# Patient Record
Sex: Male | Born: 1960 | Race: White | Hispanic: No | Marital: Married | Smoking: Former smoker
Health system: Southern US, Community
[De-identification: ages and names within clinical notes are randomized; demographics above are authoritative.]

## PROBLEM LIST (undated history)

## (undated) DIAGNOSIS — C801 Malignant (primary) neoplasm, unspecified: Secondary | ICD-10-CM

## (undated) DIAGNOSIS — J329 Chronic sinusitis, unspecified: Secondary | ICD-10-CM

## (undated) DIAGNOSIS — R51 Headache: Secondary | ICD-10-CM

## (undated) DIAGNOSIS — J189 Pneumonia, unspecified organism: Secondary | ICD-10-CM

## (undated) DIAGNOSIS — R05 Cough: Secondary | ICD-10-CM

## (undated) DIAGNOSIS — R059 Cough, unspecified: Secondary | ICD-10-CM

## (undated) HISTORY — PX: HERNIA REPAIR: SHX51

## (undated) HISTORY — DX: Cough, unspecified: R05.9

## (undated) HISTORY — DX: Chronic sinusitis, unspecified: J32.9

## (undated) HISTORY — PX: OTHER SURGICAL HISTORY: SHX169

## (undated) HISTORY — PX: LIVER LOBECTOMY: SHX1976

## (undated) HISTORY — DX: Cough: R05

## (undated) HISTORY — PX: COLECTOMY: SHX59

## (undated) HISTORY — PX: CHOLECYSTECTOMY: SHX55

## (undated) HISTORY — PX: LUNG SURGERY: SHX703

## (undated) HISTORY — PX: SINUS ENDO W/FUSION: SHX777

---

## 2001-04-07 ENCOUNTER — Encounter: Payer: Self-pay | Admitting: Orthopedic Surgery

## 2001-04-07 ENCOUNTER — Encounter: Admission: RE | Admit: 2001-04-07 | Discharge: 2001-04-07 | Payer: Self-pay | Admitting: Orthopedic Surgery

## 2001-04-28 ENCOUNTER — Encounter: Admission: RE | Admit: 2001-04-28 | Discharge: 2001-04-28 | Payer: Self-pay | Admitting: Orthopedic Surgery

## 2001-04-28 ENCOUNTER — Encounter: Payer: Self-pay | Admitting: Orthopedic Surgery

## 2006-02-04 ENCOUNTER — Encounter: Payer: Self-pay | Admitting: Pulmonary Disease

## 2006-05-03 ENCOUNTER — Encounter: Payer: Self-pay | Admitting: Pulmonary Disease

## 2006-05-07 ENCOUNTER — Ambulatory Visit: Payer: Self-pay | Admitting: Pulmonary Disease

## 2006-05-09 ENCOUNTER — Ambulatory Visit: Payer: Self-pay | Admitting: Cardiovascular Disease

## 2006-05-09 ENCOUNTER — Encounter: Payer: Self-pay | Admitting: Pulmonary Disease

## 2006-05-15 ENCOUNTER — Ambulatory Visit: Payer: Self-pay | Admitting: Pulmonary Disease

## 2006-06-07 ENCOUNTER — Ambulatory Visit: Payer: Self-pay | Admitting: Pulmonary Disease

## 2006-07-11 ENCOUNTER — Ambulatory Visit (HOSPITAL_COMMUNITY): Admission: RE | Admit: 2006-07-11 | Discharge: 2006-07-11 | Payer: Self-pay | Admitting: Otolaryngology

## 2006-07-11 ENCOUNTER — Encounter (INDEPENDENT_AMBULATORY_CARE_PROVIDER_SITE_OTHER): Payer: Self-pay | Admitting: Otolaryngology

## 2006-08-13 ENCOUNTER — Ambulatory Visit: Payer: Self-pay | Admitting: Internal Medicine

## 2006-08-27 ENCOUNTER — Ambulatory Visit: Payer: Self-pay | Admitting: Pulmonary Disease

## 2006-09-11 ENCOUNTER — Ambulatory Visit: Payer: Self-pay | Admitting: Pulmonary Disease

## 2006-10-07 ENCOUNTER — Ambulatory Visit: Payer: Self-pay | Admitting: Pulmonary Disease

## 2006-10-14 ENCOUNTER — Ambulatory Visit: Payer: Self-pay | Admitting: Pulmonary Disease

## 2006-10-24 ENCOUNTER — Ambulatory Visit: Payer: Self-pay | Admitting: Pulmonary Disease

## 2006-10-26 DIAGNOSIS — J45909 Unspecified asthma, uncomplicated: Secondary | ICD-10-CM | POA: Insufficient documentation

## 2006-11-02 DIAGNOSIS — J329 Chronic sinusitis, unspecified: Secondary | ICD-10-CM | POA: Insufficient documentation

## 2006-11-13 ENCOUNTER — Ambulatory Visit: Payer: Self-pay | Admitting: Pulmonary Disease

## 2006-11-29 ENCOUNTER — Telehealth: Payer: Self-pay | Admitting: Pulmonary Disease

## 2006-12-11 ENCOUNTER — Ambulatory Visit: Payer: Self-pay | Admitting: Pulmonary Disease

## 2006-12-11 ENCOUNTER — Encounter: Payer: Self-pay | Admitting: Pulmonary Disease

## 2006-12-30 ENCOUNTER — Ambulatory Visit: Payer: Self-pay | Admitting: Pulmonary Disease

## 2006-12-30 DIAGNOSIS — R05 Cough: Secondary | ICD-10-CM

## 2006-12-30 DIAGNOSIS — R059 Cough, unspecified: Secondary | ICD-10-CM | POA: Insufficient documentation

## 2007-01-06 ENCOUNTER — Encounter: Payer: Self-pay | Admitting: Pulmonary Disease

## 2007-01-06 ENCOUNTER — Telehealth: Payer: Self-pay | Admitting: Pulmonary Disease

## 2007-01-08 ENCOUNTER — Inpatient Hospital Stay (HOSPITAL_COMMUNITY): Admission: EM | Admit: 2007-01-08 | Discharge: 2007-01-15 | Payer: Self-pay | Admitting: Emergency Medicine

## 2007-01-08 ENCOUNTER — Telehealth (INDEPENDENT_AMBULATORY_CARE_PROVIDER_SITE_OTHER): Payer: Self-pay | Admitting: *Deleted

## 2007-01-08 ENCOUNTER — Ambulatory Visit: Payer: Self-pay | Admitting: Pulmonary Disease

## 2007-01-10 ENCOUNTER — Encounter: Payer: Self-pay | Admitting: Pulmonary Disease

## 2007-01-27 ENCOUNTER — Ambulatory Visit: Payer: Self-pay | Admitting: Pulmonary Disease

## 2007-06-30 ENCOUNTER — Ambulatory Visit: Payer: Self-pay | Admitting: Pulmonary Disease

## 2007-08-06 ENCOUNTER — Encounter: Payer: Self-pay | Admitting: Pulmonary Disease

## 2007-09-18 ENCOUNTER — Telehealth (INDEPENDENT_AMBULATORY_CARE_PROVIDER_SITE_OTHER): Payer: Self-pay | Admitting: *Deleted

## 2007-09-18 ENCOUNTER — Encounter: Payer: Self-pay | Admitting: Pulmonary Disease

## 2007-09-19 ENCOUNTER — Ambulatory Visit: Payer: Self-pay | Admitting: Internal Medicine

## 2007-09-22 ENCOUNTER — Encounter: Payer: Self-pay | Admitting: Pulmonary Disease

## 2007-10-10 ENCOUNTER — Encounter: Payer: Self-pay | Admitting: Pulmonary Disease

## 2007-10-13 ENCOUNTER — Telehealth: Payer: Self-pay | Admitting: Pulmonary Disease

## 2007-10-15 ENCOUNTER — Ambulatory Visit: Payer: Self-pay | Admitting: Pulmonary Disease

## 2007-10-27 ENCOUNTER — Ambulatory Visit: Payer: Self-pay | Admitting: Pulmonary Disease

## 2007-10-27 ENCOUNTER — Inpatient Hospital Stay (HOSPITAL_COMMUNITY): Admission: AD | Admit: 2007-10-27 | Discharge: 2007-10-31 | Payer: Self-pay | Admitting: Pulmonary Disease

## 2007-10-31 ENCOUNTER — Telehealth (INDEPENDENT_AMBULATORY_CARE_PROVIDER_SITE_OTHER): Payer: Self-pay | Admitting: *Deleted

## 2007-11-03 ENCOUNTER — Telehealth (INDEPENDENT_AMBULATORY_CARE_PROVIDER_SITE_OTHER): Payer: Self-pay | Admitting: *Deleted

## 2007-11-25 ENCOUNTER — Telehealth (INDEPENDENT_AMBULATORY_CARE_PROVIDER_SITE_OTHER): Payer: Self-pay | Admitting: *Deleted

## 2007-12-01 ENCOUNTER — Ambulatory Visit: Payer: Self-pay | Admitting: Pulmonary Disease

## 2007-12-01 DIAGNOSIS — J209 Acute bronchitis, unspecified: Secondary | ICD-10-CM | POA: Insufficient documentation

## 2007-12-01 DIAGNOSIS — J019 Acute sinusitis, unspecified: Secondary | ICD-10-CM | POA: Insufficient documentation

## 2007-12-05 ENCOUNTER — Telehealth (INDEPENDENT_AMBULATORY_CARE_PROVIDER_SITE_OTHER): Payer: Self-pay | Admitting: *Deleted

## 2007-12-09 ENCOUNTER — Ambulatory Visit: Payer: Self-pay | Admitting: Pulmonary Disease

## 2007-12-09 ENCOUNTER — Inpatient Hospital Stay (HOSPITAL_COMMUNITY): Admission: AD | Admit: 2007-12-09 | Discharge: 2007-12-12 | Payer: Self-pay | Admitting: Pulmonary Disease

## 2007-12-10 ENCOUNTER — Encounter (INDEPENDENT_AMBULATORY_CARE_PROVIDER_SITE_OTHER): Payer: Self-pay | Admitting: Otolaryngology

## 2007-12-12 ENCOUNTER — Ambulatory Visit (HOSPITAL_COMMUNITY): Admission: RE | Admit: 2007-12-12 | Discharge: 2007-12-12 | Payer: Self-pay | Admitting: Pediatrics

## 2007-12-24 ENCOUNTER — Ambulatory Visit: Payer: Self-pay | Admitting: Pulmonary Disease

## 2008-01-07 ENCOUNTER — Encounter: Payer: Self-pay | Admitting: Pulmonary Disease

## 2008-01-27 ENCOUNTER — Encounter: Payer: Self-pay | Admitting: Pulmonary Disease

## 2008-03-23 ENCOUNTER — Ambulatory Visit: Payer: Self-pay | Admitting: Pulmonary Disease

## 2008-06-28 ENCOUNTER — Telehealth (INDEPENDENT_AMBULATORY_CARE_PROVIDER_SITE_OTHER): Payer: Self-pay | Admitting: *Deleted

## 2008-06-29 ENCOUNTER — Ambulatory Visit: Payer: Self-pay | Admitting: Pulmonary Disease

## 2008-07-13 ENCOUNTER — Encounter: Payer: Self-pay | Admitting: Pulmonary Disease

## 2008-09-01 ENCOUNTER — Encounter: Payer: Self-pay | Admitting: Pulmonary Disease

## 2008-09-24 ENCOUNTER — Ambulatory Visit: Payer: Self-pay | Admitting: Pulmonary Disease

## 2008-12-14 ENCOUNTER — Encounter: Payer: Self-pay | Admitting: Pulmonary Disease

## 2009-01-24 ENCOUNTER — Ambulatory Visit: Payer: Self-pay | Admitting: Pulmonary Disease

## 2009-03-15 ENCOUNTER — Encounter: Payer: Self-pay | Admitting: Pulmonary Disease

## 2009-03-23 ENCOUNTER — Ambulatory Visit: Payer: Self-pay | Admitting: Pulmonary Disease

## 2009-03-31 ENCOUNTER — Ambulatory Visit: Payer: Self-pay | Admitting: Pulmonary Disease

## 2009-04-11 ENCOUNTER — Telehealth (INDEPENDENT_AMBULATORY_CARE_PROVIDER_SITE_OTHER): Payer: Self-pay | Admitting: *Deleted

## 2009-04-18 ENCOUNTER — Telehealth (INDEPENDENT_AMBULATORY_CARE_PROVIDER_SITE_OTHER): Payer: Self-pay | Admitting: *Deleted

## 2009-04-18 ENCOUNTER — Inpatient Hospital Stay (HOSPITAL_COMMUNITY): Admission: AD | Admit: 2009-04-18 | Discharge: 2009-04-22 | Payer: Self-pay | Admitting: Pulmonary Disease

## 2009-04-18 ENCOUNTER — Ambulatory Visit: Payer: Self-pay | Admitting: Pulmonary Disease

## 2009-05-05 ENCOUNTER — Encounter: Payer: Self-pay | Admitting: Pulmonary Disease

## 2009-05-12 ENCOUNTER — Ambulatory Visit: Payer: Self-pay | Admitting: Pulmonary Disease

## 2009-06-03 ENCOUNTER — Telehealth: Payer: Self-pay | Admitting: Pulmonary Disease

## 2009-06-17 ENCOUNTER — Telehealth (INDEPENDENT_AMBULATORY_CARE_PROVIDER_SITE_OTHER): Payer: Self-pay | Admitting: *Deleted

## 2009-06-22 ENCOUNTER — Encounter: Payer: Self-pay | Admitting: Pulmonary Disease

## 2009-07-28 ENCOUNTER — Telehealth (INDEPENDENT_AMBULATORY_CARE_PROVIDER_SITE_OTHER): Payer: Self-pay | Admitting: *Deleted

## 2009-08-26 ENCOUNTER — Telehealth (INDEPENDENT_AMBULATORY_CARE_PROVIDER_SITE_OTHER): Payer: Self-pay | Admitting: *Deleted

## 2009-09-12 ENCOUNTER — Telehealth: Payer: Self-pay | Admitting: Internal Medicine

## 2009-09-14 ENCOUNTER — Ambulatory Visit: Payer: Self-pay | Admitting: Pulmonary Disease

## 2009-09-14 DIAGNOSIS — J45901 Unspecified asthma with (acute) exacerbation: Secondary | ICD-10-CM | POA: Insufficient documentation

## 2009-10-05 ENCOUNTER — Telehealth: Payer: Self-pay | Admitting: Pulmonary Disease

## 2009-11-18 ENCOUNTER — Telehealth (INDEPENDENT_AMBULATORY_CARE_PROVIDER_SITE_OTHER): Payer: Self-pay | Admitting: *Deleted

## 2009-11-18 ENCOUNTER — Ambulatory Visit: Payer: Self-pay | Admitting: Pulmonary Disease

## 2009-11-22 ENCOUNTER — Telehealth (INDEPENDENT_AMBULATORY_CARE_PROVIDER_SITE_OTHER): Payer: Self-pay | Admitting: *Deleted

## 2009-11-30 ENCOUNTER — Telehealth: Payer: Self-pay | Admitting: Pulmonary Disease

## 2009-12-07 ENCOUNTER — Ambulatory Visit: Payer: Self-pay | Admitting: Pulmonary Disease

## 2009-12-14 ENCOUNTER — Telehealth (INDEPENDENT_AMBULATORY_CARE_PROVIDER_SITE_OTHER): Payer: Self-pay | Admitting: *Deleted

## 2009-12-28 ENCOUNTER — Telehealth (INDEPENDENT_AMBULATORY_CARE_PROVIDER_SITE_OTHER): Payer: Self-pay | Admitting: *Deleted

## 2009-12-30 ENCOUNTER — Encounter: Payer: Self-pay | Admitting: Internal Medicine

## 2009-12-30 ENCOUNTER — Ambulatory Visit: Payer: Self-pay | Admitting: Internal Medicine

## 2010-01-04 LAB — CONVERTED CEMR LAB
Basophils Absolute: 0 10*3/uL (ref 0.0–0.1)
Basophils Relative: 0.3 % (ref 0.0–3.0)
Eosinophils Absolute: 0.1 10*3/uL (ref 0.0–0.7)
HCT: 44.5 % (ref 39.0–52.0)
Hemoglobin: 15 g/dL (ref 13.0–17.0)
Lymphocytes Relative: 8.8 % — ABNORMAL LOW (ref 12.0–46.0)
Monocytes Absolute: 0.6 10*3/uL (ref 0.1–1.0)
Monocytes Relative: 5.4 % (ref 3.0–12.0)
Neutrophils Relative %: 84.2 % — ABNORMAL HIGH (ref 43.0–77.0)

## 2010-01-05 ENCOUNTER — Ambulatory Visit: Payer: Self-pay | Admitting: Adult Health

## 2010-01-13 ENCOUNTER — Ambulatory Visit: Admit: 2010-01-13 | Payer: Self-pay | Admitting: Internal Medicine

## 2010-02-07 NOTE — Progress Notes (Signed)
Summary: cough > prednisone and augmentin  Phone Note Call from Patient Call back at [phone]   Caller: Patient Call For: clance Reason for Call: Talk to Nurse Summary of Call: Patient calling about his cough.  He said he has been on prednisone and it is not helping with his cough.  Patient asking to speak to nurse, patient being a little vague.  Initial call taken by: Lehman Prom,  December 14, 2009 8:20 AM  Follow-up for Phone Call        Cough was some better when on high dose of Prednisone.  Cough is now back and worse with yellow sputum.  Finished Pred taper today.  Was unable to take Merpergan.  Is still on higher dose of Dulera.  Pt reports that he now coughs so hard his back hurts and his esophagus is "burning".  Pt reports that endoscopy showed no acid reflux but stomach is not emptying properly.  Pt reports that he is ready to go in the hospital if Dr clance agrees.  Please advise. Abigail Miyamoto RN  December 14, 2009 9:05 AM   Additional Follow-up for Phone Call Additional follow up Details #1::        why was he not able to take mepergan?  this has not been an issue in the past. Additional Follow-up by: Barbaraann Share MD,  December 14, 2009 11:57 AM    Additional Follow-up for Phone Call Additional follow up Details #2::    Pt states he is taking the meperidine. He states his cough is better when he is on higher dose of prednisone, but when he tapers down his cough returns. He staes he coughs so much and so hard now that he actually passed out yesterday from it. He states meperidine does help some but cough never goes away. Pelase advsie. Carron Curie CMA  December 14, 2009 12:27 PM   Additional Follow-up for Phone Call Additional follow up Details #3:: Details for Additional Follow-up Action Taken: we know the problem when he starts coughing like this is usually infection in sinuses and airway inflammation.  If the prednisone helped at higher doses, will treat with  this along with abx.  He can continue the meperidine for cough.  He needs to make sure that he stays away from smokeless tobacco.  Lets give this some time and see if things improve.  He will not be able to go in hospital every time this happens....insurance will start denying (there were issues last admission if he remembers) call in prednisone 20mg ...2 each day until visit with me in 2 weeks (#40) also call in augmentin 875mg  one two times a day for 10 days, no fills.  Additional Follow-up by: Barbaraann Share MD,  December 14, 2009 1:35 PM  New/Updated Medications: PREDNISONE 20 MG TABS (PREDNISONE) 2 tabs by mouth once daily till gone AUGMENTIN 875-125 MG TABS (AMOXICILLIN-POT CLAVULANATE) Take 1 tablet by mouth two times a day x10days Prescriptions: AUGMENTIN 875-125 MG TABS (AMOXICILLIN-POT CLAVULANATE) Take 1 tablet by mouth two times a day x10days  #20 x 0   Entered by:   Boone Master CNA/MA   Authorized by:   Barbaraann Share MD   Signed by:   Boone Master CNA/MA on 12/14/2009   Method used:   Electronically to        [facility]* (retail)       [address]       [place], [place]  [address]  Ph: [phone]       Fax: [phone]   RxID:   [id number] PREDNISONE 20 MG TABS (PREDNISONE) 2 tabs by mouth once daily till gone  #40 x 0   Entered by:   Boone Master CNA/MA   Authorized by:   Barbaraann Share MD   Signed by:   Boone Master CNA/MA on 12/14/2009   Method used:   Electronically to        [facility] (retail)       [address]       [place], [place]  [address]       Ph: [phone]       Fax: [phone]   RxID:   [id number]    called spoke with patient.  advised of KC's recs as stated above.  pt verbalized his understanding.  prednisone and augmentin sent to [facility].  pt is aware to keep the 1.5.12 appt w/ KC.

## 2010-02-07 NOTE — Letter (Signed)
Summary: [facility]  [facility]   Imported By: Lennie Odor 03/30/2009 11:22:23  _____________________________________________________________________  External Attachment:    Type:   Image     Comment:   External Document

## 2010-02-07 NOTE — Progress Notes (Signed)
Summary: ? admit today per pt - spoke w/ KC yesterday----PATIENT SICK----  Phone Note Call from Patient Call back at Home Phone [phone]   Caller: Patient Call For: clance Summary of Call: Pt said he spoke with [facility] yesterday, and he told him to call today if his cough wasn't better and he would admit him to the hospital, cough not better, pls advise. Initial call taken by: Darletta Moll,  April 18, 2009 9:50 AM  Follow-up for Phone Call        called, spoke with pt.  Pt states he is still coughing "uncontrollably."  States he hasn't slept in 2 nights.  States it is a prod cough with "milky" mucus.  States he talked with [facility] yesterday and was told to call office back if cough is no better today and he would probably be admitted.  Will forward to [facility] - pls advise.  Thanks! Gweneth Dimitri RN  April 18, 2009 10:09 AM     Additional Follow-up for Phone Call Additional follow up Details #1::        Aundra Millet, find out where he wants to be admitted and then work on a regular floor bed for refractory cough.  thanks.  let me know the info Additional Follow-up by: Barbaraann Share MD,  April 18, 2009 10:58 AM    Additional Follow-up for Phone Call Additional follow up Details #2::    spoke with pt.  pt states he doesn't have a preference on [facility] or [facility].  Will admit to [facility].  called bed control at [facility] and waiting on a bed.  Aundra Millet Reynolds LPN  April 18, 2009 11:05 AM   received call back from [facility] with Bed Control.  Pt's room # is 1517 at [facility].  called and spoke with pt.  pt aware to go to Admitting at [facility] and that his room# is 1517  [employer] LPN  April 18, 2009 12:00 PM

## 2010-02-07 NOTE — Assessment & Plan Note (Signed)
Summary: acute sick visit for refractory cough   Visit Type:  Follow-up Copy to:  [facility] Primary Provider/Referring Provider:  Dr. Bebe Shaggy  CC:  2 week follow up. pt states breahting is getting worse. pt c/o cough w/ milky thick sticky phlem, wheezing, and chest tightness and congestions. pt states he needs a refill on ativan. pt states he quit smoking in 1980's. Marland Kitchen  History of Present Illness: patient comes in today for an acute sick visit related to worsening chronic cough.  The pt has known asthma, chronic sinusitis, and severe upper airway dysfunction syndrome that I suspect is being partially driven by his excessive smokeless tobacco use.  He was just seen by our NP 3 weeks ago for refractory cough and sob, and given a prednisone taper.  He had been seen by ENT just before that visit, and had been treated with avelox x 10d for acute on chronic sinusitis.  He comes in today where he continues to have violent coughing with white sticky mucus, worsening chest congestion, and increasing sob.  The pt states that he does better on prednisone, but has worsening on lower doses.    Current Medications (verified): 1)  Ventolin Hfa 108 (90 Base) Mcg/act  Aers (Albuterol Sulfate) .Marland Kitchen.. 1-2 Puffs Every 4-6 Hours As Needed 2)  Prilosec 20 Mg  Cpdr (Omeprazole) .... Take 1 Tablet By Mouth Two Times A Day 3)  Dulera 100-5 Mcg/act Aero (Mometasone Furo-Formoterol Fum) .... Inhale 2 Puffs Two Times A Day 4)  Albuterol Sulfate (2.5 Mg/32ml) 0.083% Nebu (Albuterol Sulfate) .... Use 1 Vial in Nebulizer Every 4 Hours As Needed 5)  Neurontin 300 Mg Caps (Gabapentin) .... Take 1 Tablet By Mouth Two Times A Day 6)  Ativan 1 Mg Tabs (Lorazepam) .Marland Kitchen.. 1 By Mouth Two Times A Day As Needed Anxiety 7)  Celexa 20 Mg Tabs (Citalopram Hydrobromide) .... 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   Past Surgical History: s/p colectomy  Family History: Reviewed history from 10/27/2007 and no changes required. sister with severe obstructive disease. o/w unremarkable per pt.  Social History: Reviewed history from 10/27/2007 and no changes required. 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 uses smokeless tobacco regularly  Review of Systems       The patient complains of shortness of breath with activity, shortness of breath at rest, productive cough, headaches, anxiety, and depression.  The patient denies non-productive cough, coughing up blood, chest pain, irregular heartbeats, acid heartburn, indigestion, loss of appetite, weight change, abdominal pain, difficulty swallowing, sore throat, tooth/dental problems, nasal congestion/difficulty breathing through nose, sneezing, itching, ear ache, hand/feet swelling, joint stiffness or pain, rash, change in color of mucus, and fever.    Vital Signs:  Patient profile:   50 year old male Height:      74 inches Weight:      186.13 pounds BMI:     23.98 O2 Sat:      98 % on Room air Temp:     98.1 degrees F oral Pulse rate:   68 / minute BP sitting:   118 / 66  (left arm) Cuff size:   regular  Vitals Entered  By: Carver Fila (December 07, 2009 9:38 AM)  O2 Flow:  Room air CC: 2 week follow up. pt states breahting is getting worse. pt c/o cough w/ milky thick sticky phlem, wheezing, chest tightness and congestions. pt states he needs a refill on ativan. pt states he quit smoking in 1980's.  Comments meds and allergies updated Phone number updated Carver Fila  December 07, 2009 9:38 AM    Physical Exam  General:  thin male in nad Nose:  patent without discharge left with edema and mild  inflammation. Mouth:  clear, no exudates Lungs:  totally clear to auscultation +upper airway noise with transmission to lower airways.  no true wheezing. Heart:  rrr, no mrg Extremities:  no edema or cyanosis  Neurologic:  alert and oriented, moves all 4.   Impression & Recommendations:  Problem # 1:  COUGH (ICD-786.2)  the cough is clearly upper airway in origin, as it usually is.  It typically gets started with a sinus infection, and then propagates thru a cyclical cough mechanism.  He usually responds to prednisone and aggressive cough suppression.  He has required hospitalizaton in the past to suppress the cough enough with narcotics to break the cycle (as well as iv abx and steroids).  He is clearly not to that point at the visit today, although he is frustrated by all of this.  Will try and aggressively suppress his cough, treat with another course of steroids.  If he continues to have issues, will need a cxr for completeness.  The pt also tells me that he has an appt at [facility] for a second pulmonary opinion, which I have encouraged, considering the ongoing nature of his issues despite our best efforts.  Problem # 2:  ASTHMA, INTRINSIC (ICD-493.10) the pt is moving air well, and has no true wheezing on exam today.  Because he sees improvement with higher doses of prednisone,will increase his dulera to the higher strength as a trial.  However, we need to be careful not to irritate his upper airway further.    Medications Added to Medication List This Visit: 1)  Prednisone 10 Mg Tabs (Prednisone) .... Take 4 each day for 2 days, then 3 each day for 2 days, then 2 each day for 2 days, then 1 each day for 2 days, then stop 2)  Meperidine Hcl 50 Mg Tabs (Meperidine hcl) .... One every 4-6 hours as needed for cough suppression  Other Orders: Est. Patient Level IV ([address])  Patient Instructions: 1)  will put on prednisone taper to help with upper airway inflammation 2)  you need to stop  smokeless tobacco...this contributes to your upper instability 3)  take demerol for cough suppression up to every 4 hours, voice rest, hard candy to bathe the back of your throat. 4)  will increase dulera to the higher strength 200/5  2 inhalations am and pm...rinse mouth well. 5)  keep apptm with GI, and with [facility].   Prescriptions: MEPERIDINE HCL 50 MG TABS (MEPERIDINE HCL) one every 4-6 hours as needed for cough suppression  #40 x 0   Entered and Authorized by:   Barbaraann Share MD   Signed by:   Barbaraann Share MD on 12/07/2009   Method used:   Print then Give to Patient   RxID:   [id number] PREDNISONE 10 MG  TABS (PREDNISONE) take 4 each day for 2 days, then 3 each day for 2 days, then 2 each day for 2 days, then 1  each day for 2 days, then stop  #20 x 0   Entered and Authorized by:   Barbaraann Share MD   Signed by:   Barbaraann Share MD on 12/07/2009   Method used:   Print then Give to Patient   RxID:   [id number]

## 2010-02-07 NOTE — Progress Notes (Signed)
Summary: On call- Cough- biaxin, prednisone  Phone Note Call from Patient   Summary of Call: On Call- Reports bad cough, dark phlegm, asking antibiotic and prednisone taper.No definite fever. Plan- Biaxin 500mg  two times a day x 7 days. Prednisone taper 10mg , # 20, 8 days. [facility] [phone]. 09/10/09.  Keep f/u w/ Dr Shelle Iron. Initial call taken by: Waymon Budge MD,  September 12, 2009 8:02 PM

## 2010-02-07 NOTE — Assessment & Plan Note (Signed)
Summary: acute sick visit for cyclical cough with sinusitis   Copy to:  [facility] Primary Provider/Referring Provider:  Dr. Bebe Shaggy  CC:  Pt is here for a sick visit.  Pt states he recently saw Dr. Annalee Genta and was dx with sinus infection.  Pt states he was started on Avelox and Pred taper.  Pt states "infection has now traveled into chest."  Pt c/o head and chest congestion, coughing up green to brown sputum, and coughing so hard he is passing out.  Marland Kitchen  History of Present Illness: the pt comes in today for an acute sick visit.  He has known chronic sinusitis with underlying asthma, as well as a hypersensitized upper airway with predilection toward cyclical coughing.  He was recently diagosed with acute sinusitis by ENT, and was treated with avelox and prednisone taper.  He continues to have head congestion, but now having chest congestion and coughing up green mucus. He is still on his abx and steroid taper.  His main issue is severe refractory cough with near syncope.  This is a common problem for him when he gets infected.  Current Medications (verified): 1)  Ventolin Hfa 108 (90 Base) Mcg/act  Aers (Albuterol Sulfate) .Marland Kitchen.. 1-2 Puffs Every 4-6 Hours As Needed 2)  Prilosec 20 Mg  Cpdr (Omeprazole) .... Take 1 Tablet By Mouth Two Times A Day 3)  Symbicort 160-4.5 Mcg/act  Aero (Budesonide-Formoterol Fumarate) .... Two Puffs Twice Daily 4)  Albuterol Sulfate (2.5 Mg/33ml) 0.083% Nebu (Albuterol Sulfate) .... Use 1 Vial in Nebulizer Every 4 Hours As Needed 5)  Flonase 50 Mcg/act  Susp (Fluticasone Propionate) .... Two Puffs Each Nostril Daily 6)  Prednisone 5 Mg Tabs (Prednisone) .... Taper As Directed 7)  Avelox 400 Mg Tabs (Moxifloxacin Hcl) .... Take 1 Tablet By Mouth Once A Day For 14 Days  Allergies (verified): 1)  * Advair  Review of Systems       The patient complains of productive cough, nasal congestion/difficulty breathing through nose, and change in color of mucus.  The patient  denies shortness of breath with activity, shortness of breath at rest, non-productive cough, coughing up blood, chest pain, irregular heartbeats, acid heartburn, indigestion, loss of appetite, weight change, abdominal pain, difficulty swallowing, sore throat, tooth/dental problems, headaches, sneezing, itching, ear ache, anxiety, depression, hand/feet swelling, joint stiffness or pain, rash, and fever.    Vital Signs:  Patient profile:   50 year old male Height:      74 inches Weight:      186 pounds BMI:     23.97 O2 Sat:      98 % on Room air Temp:     98.2 degrees F oral Pulse rate:   89 / minute BP sitting:   126 / 80  (right arm) Cuff size:   regular  Vitals Entered By: Arman Filter LPN (March 23, 2009 10:21 AM)  O2 Flow:  Room air CC: Pt is here for a sick visit.  Pt states he recently saw Dr. Annalee Genta and was dx with sinus infection.  Pt states he was started on Avelox and Pred taper.  Pt states "infection has now traveled into chest."  Pt c/o head and chest congestion, coughing up green to brown sputum, coughing so hard he is passing out.   Comments Medications reviewed with patient Arman Filter LPN  March 23, 2009 10:24 AM    Physical Exam  General:  thin male in nad Nose:  mucosal edema and inflammation, no purulence  currently Lungs:  totally clear to auscultation. no wheezing or crackles. Heart:  rrr, no mrg. Extremities:  no edema noted, no cyanosis Neurologic:  alert and oriented, moves all 4.   Impression & Recommendations:  Problem # 1:  COUGH (ICD-786.2) the pt has a severe cyclical cough, and will need aggressive cough suppression.  In the past, he has responded to demerol/phenergan/ativan, but has also required hospitalization.  Will try the above "cocktail", and see if we can improve things.  He also understands the various behavioral techniques that can help as well.  Problem # 2:  SINUSITIS, CHRONIC NOS (ICD-473.9) this has been an ongoing problem for  him, and he is followed very closely by Dr. Annalee Genta.    Problem # 3:  ASTHMA, INTRINSIC (ICD-493.10) no evidence for acute flare.  Medications Added to Medication List This Visit: 1)  Prednisone 5 Mg Tabs (Prednisone) .... Taper as directed 2)  Avelox 400 Mg Tabs (Moxifloxacin hcl) .... Take 1 tablet by mouth once a day for 14 days 3)  Meperidine Hcl 50 Mg Tabs (Meperidine hcl) .... One every 4-6 hrs as needed. 4)  Promethazine Hcl 25 Mg Tabs (Promethazine hcl) .... One every 4-6hrs as needed. 5)  Ativan 0.5 Mg Tabs (Lorazepam) .... One in am and pm  Other Orders: Est. Patient Level IV (66440)  Patient Instructions: 1)  finish up abx and prednisone taper 2)  will try demerol 50mg  one every 4-6 hrs as needed for cough 3)  take phenergan 25mg  each time you take demerol.  Do not drive if taking medications 4)  ativan 0.5mg  one in am and pm while coughing. 5)  followup with me next week.   Prescriptions: ATIVAN 0.5 MG TABS (LORAZEPAM) one in am and pm  #15 x 0   Entered and Authorized by:   Barbaraann Share MD   Signed by:   Barbaraann Share MD on 03/23/2009   Method used:   Print then Give to Patient   RxID:   [id number] PROMETHAZINE HCL 25 MG TABS (PROMETHAZINE HCL) one every 4-6hrs as needed.  #40 x 0   Entered and Authorized by:   Barbaraann Share MD   Signed by:   Barbaraann Share MD on 03/23/2009   Method used:   Print then Give to Patient   RxID:   [id number] MEPERIDINE HCL 50 MG TABS (MEPERIDINE HCL) one every 4-6 hrs as needed.  #40 x 0   Entered and Authorized by:   Barbaraann Share MD   Signed by:   Barbaraann Share MD on 03/23/2009   Method used:   Print then Give to Patient   RxID:   [id number]

## 2010-02-07 NOTE — Progress Notes (Signed)
Summary: rx request  Phone Note Call from Patient   Caller: Patient Call For: clance Summary of Call: pt needs rx's [facility] to [facility] for promethazine and meperdine. fax # [phone]. pt # at work [phone]. cell [phone](call 1st) Initial call taken by: Tivis Ringer, CNA,  October 05, 2009 2:37 PM  Follow-up for Phone Call        ATC pt on cell # and was informed I had the wrong #.  ATC pt at work # [id number].  Aundra Millet Reynolds LPN  October 05, 2009 3:13 PM   (FYI- I [facility]- if kc does prescribe the meperdine, pt will need to take the written rx by hand to the pharmacist- they do not accept phone or fax order on this med)   Additional Follow-up for Phone Call Additional follow up Details #1::        called spoke with patient who states that his cough is doing well with occ mucus production of a milky, "gooey", sticky consistency.  denies wheezing, SOB.  states he was told by [facility] to take these meds everyday at 2pm and his rx from the hospital is getting low.  requests refills.  please advise, thanks! Additional Follow-up by: Boone Master CNA/MA,  October 05, 2009 3:22 PM    Additional Follow-up for Phone Call Additional follow up Details #2::    No that is not correct.  If his cough is doing ok, does not need to be taking those medications.  Need to stay off them as much as possible. Follow-up by: Barbaraann Share MD,  October 05, 2009 3:55 PM  Additional Follow-up for Phone Call Additional follow up Details #3:: Details for Additional Follow-up Action Taken: Pt states he only takes medication when needed, still has cough, only has 4 tabs of each medications above, and has had the same prescription since April. I expressed KC's above advisement and pt still requesting refills. The correct cell phone number for pt is [phone]. Please advise. Thanks. Zackery Barefoot CMA  October 05, 2009 4:58 PM   I have called the above number last week and today..left  message on machine to call us to discuss Additional Follow-up by: Barbaraann Share MD,  October 13, 2009 4:43 PM

## 2010-02-07 NOTE — Letter (Signed)
Summary: [facility]  [facility]   Imported By: Lester [place] 07/07/2009 10:33:48  _____________________________________________________________________  External Attachment:    Type:   Image     Comment:   External Document

## 2010-02-07 NOTE — Progress Notes (Signed)
Summary: REFILL  Phone Note From Pharmacy Call back at [phone]   Caller: Patient [facility]er: [facility] Call For: [facility]  Summary of Call: REFILL FOR CELEXA 20MG  FAX TO [phone] Initial call taken by: Rickard Patience,  November 30, 2009 9:36 AM  Follow-up for Phone Call        Pt's next OV is on 12/07/2009 with [facility]. Pls advise on refill for Celexa.Michel Bickers [facility]  November 30, 2009 10:02 AM  Additional Follow-up for Phone Call Additional follow up Details #1::        ok with me if he feels that it is helping him. Additional Follow-up by: Barbaraann Share MD,  November 30, 2009 2:47 PM    Additional Follow-up for Phone Call Additional follow up Details #2::    rx sent. pt aware. rx working well for him. Carron Curie CMA  November 30, 2009 2:51 PM   Prescriptions: CELEXA 20 MG TABS (CITALOPRAM HYDROBROMIDE) Take 1 tablet by mouth once a day  #30 x 4   Entered by:   Carron Curie CMA   Authorized by:   Barbaraann Share MD   Signed by:   Carron Curie CMA on 11/30/2009   Method used:   Electronically to        [facility] (retail)       [address]       [place], [place]  [address]       Ph: [phone]       Fax: [phone]   RxID:   [id number]

## 2010-02-07 NOTE — Progress Notes (Signed)
Summary: ? change symbicort to dulera  Phone Note From Pharmacy Call back at [phone]   Caller: Patient Call For: clance [facility]er: [facility] Call For: clance  Summary of Call: insurance does not cover symbicort Initial call taken by: Rickard Patience,  June 17, 2009 2:57 PM  Follow-up for Phone Call        [facility], spoke with [place].  Per Irving Burton, pt's insurance will not symbicort.  Requesting to change this to dulera 200/5.  Will forward message to KC-pls advise if ok to change symbicort to dulera.  Gweneth Dimitri RN  June 17, 2009 3:52 PM   Additional Follow-up for Phone Call Additional follow up Details #1::        so we can't get symbicort even with PA?  If not, would change to dulera 100/5  2 inhalations am and pm.  tell pt to call if this worsens his cough. Additional Follow-up by: Barbaraann Share MD,  June 17, 2009 5:28 PM    Additional Follow-up for Phone Call Additional follow up Details #2::    called and spoke with Irving Burton from [facility] pharmacy and ok'd change from Symbicort to [facility] and pt is to call if this med worsens his cough.  Aundra Millet Reynolds LPN  June 17, 2009 5:38 PM   New/Updated Medications: DULERA 100-5 MCG/ACT AERO (MOMETASONE FURO-FORMOTEROL FUM) Inhale 2 puffs two times a day

## 2010-02-07 NOTE — Letter (Signed)
Summary: Out of Work  [employer]  [address]   [place], [place] [address]   Phone: [phone]  Fax: [phone]    March 23, 2009   Employee:  Ralph Hill    To Whom It May Concern:   For Medical reasons, please excuse the above named employee from work for the following dates:  Start:   Wednesday March 23, 2009  End:   Thursday March 31, 2009  If you need additional information, please feel free to contact our office.         Sincerely,      Marcelyn Bruins, M.D

## 2010-02-07 NOTE — Assessment & Plan Note (Signed)
Summary: rov for cyclical coughing.   Copy to:  [facility] Primary Provider/Referring Provider:  Dr. Bebe Shaggy  CC:  1 week follow-up.  feeling better, no sob, occass.l cough-brown in am, then white, and wheezing occass. no fcs.  History of Present Illness: the pt comes in today for f/u after being treated aggressively for cyclical coughing associated with an episode of acute on chronic sinusitis.  He was given mepergan forte, and asked to follow the cyclical cough protocol.  He comes in today where he is much improved.  Very few cough paroxysms.  He still has a little bit of purulence from his sinuses, but overall is better.  Current Medications (verified): 1)  Ventolin Hfa 108 (90 Base) Mcg/act  Aers (Albuterol Sulfate) .Marland Kitchen.. 1-2 Puffs Every 4-6 Hours As Needed 2)  Prilosec 20 Mg  Cpdr (Omeprazole) .... Take 1 Tablet By Mouth Two Times A Day 3)  Symbicort 160-4.5 Mcg/act  Aero (Budesonide-Formoterol Fumarate) .... Two Puffs Twice Daily 4)  Albuterol Sulfate (2.5 Mg/65ml) 0.083% Nebu (Albuterol Sulfate) .... Use 1 Vial in Nebulizer Every 4 Hours As Needed 5)  Flonase 50 Mcg/act  Susp (Fluticasone Propionate) .... Two Puffs Each Nostril Daily 6)  Prednisone 5 Mg Tabs (Prednisone) .... Take 1 Tablet By Mouth Once A Day 7)  Meperidine Hcl 50 Mg Tabs (Meperidine Hcl) .... One Every 4-6 Hrs As Needed. 8)  Promethazine Hcl 25 Mg Tabs (Promethazine Hcl) .... One Every 4-6hrs As Needed.  Allergies (verified): 1)  * Advair  Past History:  Family History: Last updated: 10/27/2007 sister with severe obstructive disease. o/w unremarkable per pt.  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  Risk Factors: Smoking Status: quit (12/01/2007) Packs/Day: 1 ppd (10/04/2006)  Review of Systems      See HPI  Vital Signs:  Patient profile:   50 year old male Height:      74 inches Weight:      184.13 pounds O2 Sat:       95 % on Room air Temp:     98.1 degrees F oral Pulse rate:   90 / minute BP sitting:   122 / 90  (left arm) Cuff size:   regular  Vitals Entered By: Elray Buba RN (March 31, 2009 11:55 AM)  O2 Flow:  Room air CC: 1 week follow-up.  feeling better, no sob,occass.l cough-brown in am,then white, wheezing occass. no fcs Is Patient Diabetic? No Comments Medications reviewed with patient  Elray Buba RN  March 31, 2009 11:53 AM    Physical Exam  General:  wd male in nad Mouth:  thrush on tongue Lungs:  totally clear to auscultation. Heart:  rrr, no mrg Extremities:  no edema or cyanosis   Impression & Recommendations:  Problem # 1:  COUGH (ICD-786.2)  the pt's cough is much improved with suppression.  He has terrible cyclical coughing whenever he develops a sinopulmonary infection, all due to his hypersensitized upper airway.  He still has some purulence, but just finished his abx 2 days ago.  Now that he is improved, I have asked him to start tapering his narcotic cough medications.  Problem # 2:  ASTHMA, INTRINSIC (ICD-493.10) his lungs are totally clear on exam, and pt denies any breathing issues.    Medications Added to Medication List This Visit: 1)  Prednisone 5 Mg Tabs (Prednisone) .... Take 1 tablet by mouth once a day 2)  Nystatin 100000  Unit/ml Susp (Nystatin) .... Take one teaspoonful by mouth swish & swallow four times a day x 5 days  Other Orders: Est. Patient Level III ([address]295)  Patient Instructions: 1)  begin to taper off cough medications from last visit 2)  use nystatin mouthwash for your thrush 3)  no change in asthma meds. 4)  cancel prior apptms and followup with me in 4mos.   Prescriptions: NYSTATIN 100000 UNIT/ML  SUSP (NYSTATIN) Take one teaspoonful by mouth swish & swallow four times a day x 5 days  #qs x 0   Entered and Authorized by:   Barbaraann Share MD   Signed by:   Barbaraann Share MD on 03/31/2009   Method used:   Print then Give to  Patient   RxID:   [id number]

## 2010-02-07 NOTE — Assessment & Plan Note (Signed)
Summary: rov for asthma   Copy to:  [facility] Primary Provider/Referring Provider:  Dr. Bebe Shaggy  CC:  Pt is here for a 4 month f/u appt.  Pt states breathing is "fine."  Pt denied any sinus infection.  Pt states only an occ " tickle in throat."  Pt states he is currently on 2.5 mg of Prednisone.  Marland Kitchen  History of Present Illness: The pt comes in today for f/u of his known asthma.  He has been doing well on his current regimen, with no recent acute exacerbations or rescue inhaler use.  He has had a few episodes of sinusitis since the last visit, but was treated by Dr. Annalee Genta and now is doing well.  He denies any significant cough, chest congestion, or purulence.  Current Medications (verified): 1)  Ventolin Hfa 108 (90 Base) Mcg/act  Aers (Albuterol Sulfate) .Marland Kitchen.. 1-2 Puffs Every 4-6 Hours As Needed 2)  Prilosec 20 Mg  Cpdr (Omeprazole) .... Take 1 Tablet By Mouth Two Times A Day 3)  Symbicort 160-4.5 Mcg/act  Aero (Budesonide-Formoterol Fumarate) .... Two Puffs Twice Daily 4)  Albuterol Sulfate (2.5 Mg/49ml) 0.083% Nebu (Albuterol Sulfate) .... Use 1 Vial in Nebulizer Every 4 Hours As Needed 5)  Flonase 50 Mcg/act  Susp (Fluticasone Propionate) .... Two Puffs Each Nostril Daily 6)  Prednisone 5 Mg Tabs (Prednisone) .... Take 1/2 Tab By Mouth Once Daily  Allergies (verified): 1)  * Advair  Review of Systems      See HPI  Vital Signs:  Patient profile:   50 year old male Height:      74 inches Weight:      189 pounds BMI:     24.35 O2 Sat:      97 % on Room air Temp:     98.2 degrees F oral Pulse rate:   64 / minute BP sitting:   114 / 82  (left arm) Cuff size:   regular  Vitals Entered By: Arman Filter LPN (January 24, 2009 9:42 AM)  O2 Flow:  Room air CC: Pt is here for a 4 month f/u appt.  Pt states breathing is "fine."  Pt denied any sinus infection.  Pt states only an occ " tickle in throat."  Pt states he is currently on 2.5 mg of Prednisone.   Comments Medications  reviewed with patient Arman Filter LPN  January 24, 2009 9:43 AM    Physical Exam  General:  wd male in nad Lungs:  totally clear to auscultation Heart:  rrr   Impression & Recommendations:  Problem # 1:  ASTHMA, INTRINSIC (ICD-493.10) the pt is doing very well from an asthma standpoint.  He has had a few acute exacerbations of his sinus disease, but his asthma has remained stable.  Will continue on current bronchodilator regimen.  Other Orders: Est. Patient Level II (45409)  Patient Instructions: 1)  no change in meds 2)  followup with me in 4mos.

## 2010-02-07 NOTE — Progress Notes (Signed)
Summary: question-LMTCB x 1  Phone Note Call from Patient Call back at [phone]   Caller: wife Ralph Hill Call For: Ralph Hill Summary of Call: pt was admitted in april and she has a issue that she needs help with Initial call taken by: Lacinda Axon,  Jun 03, 2009 12:46 PM  Follow-up for Phone Call        LMOVMTCB Ralph Hill  Jun 03, 2009 1:59 PM  Returning Ralph Hill's phone call.Darletta Moll  Jun 03, 2009 3:24 PM      Additional Follow-up for Phone Call Additional follow up Details #1::        Spoke with pt's spouse.  Pt's new Government social research officer life is requiring that we send them letter on our letterhead stating pt's dx and tx from when he was recently in the hospital.  Spouse requests that when this is done we just mail her the letter to her home.  Please advise when done, thanks! Additional Follow-up by: Ralph Hill,  Jun 03, 2009 3:32 PM    Additional Follow-up for Phone Call Additional follow up Details #2::    what is more appropriate is that they get the admission and discharge notes from the hospital.  these are very detailed, and have all the clinical information.  The insurance company's job is to get hospital records and review. Follow-up by: Barbaraann Share MD,  Jun 03, 2009 3:35 PM  Additional Follow-up for Phone Call Additional follow up Details #3:: Details for Additional Follow-up Action Taken: [facility] Ralph Hill  Jun 03, 2009 3:40 PM  Spoke with pt's spouse and advised her of the above recs per [facility].  She states that this is what she did in the first place and then she got the letter stating that they need letter from here.  She called them and confirmed that they recieved all of the hospital notes.  Ins states that they need letter on letter head from pt's physician.  Please advise, thanks  done.  will call pt when signed. Additional Follow-up by: Ralph Hill,  Jun 03, 2009 3:49 PM   Appended Document: question-LMTCB x 1 letter complete.  Spoke with pt.  pt  aware letter at front desk for pt to pick up and copy will be scanned into computer.

## 2010-02-07 NOTE — Progress Notes (Signed)
  Phone Note Other Incoming   Request: Send information Summary of Call: Request for records received from [facility]. Request forwarded to [employer].     

## 2010-02-07 NOTE — Progress Notes (Signed)
Summary: RETURNED CALL - cxr results  Phone Note Call from Patient Call back at Home Phone [phone]   Caller: Patient Call For: [facility] PARRETT Summary of Call: PT RETURNED CALL FROM NURSE.  Initial call taken by: Tivis Ringer, CNA,  November 22, 2009 9:37 AM  Follow-up for Phone Call        pt aware of results per 11-22-09 append to cxr. Follow-up by: Boone Master CNA/MA,  November 22, 2009 9:49 AM

## 2010-02-07 NOTE — Letter (Signed)
Summary: [facility]  [facility]   Imported By: Sherian Rein 02/23/2009 12:20:17  _____________________________________________________________________  External Attachment:    Type:   Image     Comment:   External Document

## 2010-02-07 NOTE — Assessment & Plan Note (Signed)
Summary: posthospital f/u for asthma/sinusitis/cough   Copy to:  [facility] Primary Provider/Referring Provider:  Dr. Bebe Shaggy  CC:  Pt is here for a post hosp f/u appt.  Pt was at [facility] in April 2011.  Pt states cough has resolved.  Pt states breathing is "good."  Pt denied any new concerns.   Pt states he is still chewing tobacco.  .  History of Present Illness: the pt comes in today for f/u after his recent hospitalization for refractory cough.  The pt has 2-3 episodes of sinobronchitis yearly related to his underlying chronic sinusitis and lung disease, and usually results in progressive cough which escalates to the point of hospitalization.  He comes in today where he is doing very well, with no sob or signfiicant sinus symptoms.  He is not coughing.  He was started on  neurontin while in the hospital for possible neurogenic cough.  Unfortunately, he continues to use smokeless tobacco despite my encouragement to quit.  Current Medications (verified): 1)  Ventolin Hfa 108 (90 Base) Mcg/act  Aers (Albuterol Sulfate) .Marland Kitchen.. 1-2 Puffs Every 4-6 Hours As Needed 2)  Prilosec 20 Mg  Cpdr (Omeprazole) .... Take 1 Tablet By Mouth Two Times A Day 3)  Symbicort 160-4.5 Mcg/act  Aero (Budesonide-Formoterol Fumarate) .... Two Puffs Twice Daily 4)  Albuterol Sulfate (2.5 Mg/21ml) 0.083% Nebu (Albuterol Sulfate) .... Use 1 Vial in Nebulizer Every 4 Hours As Needed 5)  Meperidine Hcl 50 Mg Tabs (Meperidine Hcl) .... One Every 4-6 Hrs As Needed. 6)  Promethazine Hcl 25 Mg Tabs (Promethazine Hcl) .... One Every 4-6hrs As Needed. 7)  Lexapro 10 Mg Tabs (Escitalopram Oxalate) .... Take 1 Tablet By Mouth Once A Day 8)  Neurontin 300 Mg Caps (Gabapentin) .... Take 1 Tablet By Mouth Two Times A Day 9)  Ativan 1 Mg Tabs (Lorazepam) .... Take By Mouth As Needed  Allergies (verified): 1)  * Advair  Review of Systems      See HPI  Vital Signs:  Patient profile:   50 year old male Height:      74  inches Weight:      182 pounds O2 Sat:      96 % on Room air Temp:     98.3 degrees F oral Pulse rate:   93 / minute BP sitting:   128 / 70  (left arm) Cuff size:   regular  Vitals Entered By: Arman Filter LPN (May 13, 6043 11:10 AM)  O2 Flow:  Room air CC: Pt is here for a post hosp f/u appt.  Pt was at [facility] in April 2011.  Pt states cough has resolved.  Pt states breathing is "good."  Pt denied any new concerns.   Pt states he is still chewing tobacco.   Comments Medications reviewed with patient Arman Filter LPN  May 12, 4096 11:14 AM    Physical Exam  General:  wd male in nad Lungs:  totally clear Heart:  rrr, no mrg. 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  Problem # 2:  COUGH (ICD-786.2)  the pt has a very sensitized upper airway that results in severe and refractory cyclical coughing whenever he has a flare of his sinobrochitis.  We are trying him on neurontin for a possible neurogenic mechanism, and I am also concerned that his excessive  smokeless tobacco use could be irritating upper airway as well.  I have asked him to stop dipping,  but he has not been able to tolerate nicontine patch.  Medications Added to Medication List This Visit: 1)  Lexapro 10 Mg Tabs (Escitalopram oxalate) .... Take 1 tablet by mouth once a day 2)  Neurontin 300 Mg Caps (Gabapentin) .... Take 1 tablet by mouth two times a day 3)  Ativan 1 Mg Tabs (Lorazepam) .... Take by mouth as needed  Other Orders: Est. Patient Level III ([address])  Patient Instructions: 1)  no change in asthma meds. 2)  stay on neurontin am and pm 3)  stay on lexapro one each day 4)  you need to stop smokeless tobacco.  May be contributing to hypersensitivity of back of throat. 5)  followup with me in 4mos.

## 2010-02-07 NOTE — Progress Notes (Signed)
Summary: cough, okay for pred taper  Phone Note Call from Patient Call back at [phone]   Caller: Spouse//dana Call For: clance Summary of Call: Needs something for cough.//[facility] Initial call taken by: Darletta Moll,  August 26, 2009 10:32 AM  Follow-up for Phone Call        Pt spouse Annabelle Harman states pt c/o increased cough starting last Tuesday pt called Dr. Annalee Genta for same and was prescribed 6 day Prednisone taper and abx that will finish today. Prednisone helped pt's cough while on same but returned once he finished it. Annalee Genta is out of office until Monday. Wife is requesting pt be put on a longer dose of prednisone. Pt is also using a nasal neb once daily. Please advise. Thanks! Zackery Barefoot CMA  August 26, 2009 10:50 AM   Additional Follow-up for Phone Call Additional follow up Details #1::        ok to call in prednisone 20mg .Marland Kitchen..2 each day for 4 days, then 1 1/2 each day for 4 days, then 1 each day for 4 days, then stop. Additional Follow-up by: Barbaraann Share MD,  August 26, 2009 3:38 PM    New/Updated Medications: PREDNISONE 20 MG TABS (PREDNISONE) 2 each day for 4 days, then 1 1/2 each day for 4 days, then 1 each day for 4 days, then stop. Prescriptions: PREDNISONE 20 MG TABS (PREDNISONE) 2 each day for 4 days, then 1 1/2 each day for 4 days, then 1 each day for 4 days, then stop.  #18 x 0   Entered by:   Boone Master CNA/MA   Authorized by:   Barbaraann Share MD   Signed by:   Boone Master CNA/MA on 08/26/2009   Method used:   Electronically to        [facility]* (retail)       [address]       [place], [place]  [address]       Ph: [phone]       Fax: [phone]   RxID:   [id number]

## 2010-02-07 NOTE — Assessment & Plan Note (Signed)
Summary: acute sick visit for sinusitis, asthma flare   Copy to:  [facility] Primary Provider/Referring Provider:  Dr. Bebe Shaggy  CC:  Pt is here for a 4 month f/u appt.   Pt states he called the on-call dr on Sept 5 d/t increased coughing and is currently on abx and pred taper.  Pt states he is coughing up milky sputum and has increased sob with exertion.  Marland Kitchen  History of Present Illness: the pt comes in today for an acute sick visit.  He has been having increased cough, sinus congestion, purulence from nares, and postnasal drip.  He has also had worsening sob.  He called the on-call md a few days ago and was started on biaxin and a short prednisone taper.  He feels a little bit better, but is still concerned about cough escalation.  He has a long h/o cyclical cough with syncope whenever he gets sick.  Has had no f/c/s  Current Medications (verified): 1)  Ventolin Hfa 108 (90 Base) Mcg/act  Aers (Albuterol Sulfate) .Marland Kitchen.. 1-2 Puffs Every 4-6 Hours As Needed 2)  Prilosec 20 Mg  Cpdr (Omeprazole) .... Take 1 Tablet By Mouth Two Times A Day 3)  Dulera 100-5 Mcg/act Aero (Mometasone Furo-Formoterol Fum) .... Inhale 2 Puffs Two Times A Day 4)  Albuterol Sulfate (2.5 Mg/36ml) 0.083% Nebu (Albuterol Sulfate) .... Use 1 Vial in Nebulizer Every 4 Hours As Needed 5)  Meperidine Hcl 50 Mg Tabs (Meperidine Hcl) .... One Every 4-6 Hrs As Needed. 6)  Promethazine Hcl 25 Mg Tabs (Promethazine Hcl) .... One Every 4-6hrs As Needed. 7)  Neurontin 300 Mg Caps (Gabapentin) .... Take 1 Tablet By Mouth Two Times A Day 8)  Ativan 1 Mg Tabs (Lorazepam) .... Take By Mouth As Needed 9)  Celexa 20 Mg Tabs (Citalopram Hydrobromide) .... Take 1 Tablet By Mouth Once A Day 10)  Prednisone 10 Mg Tabs (Prednisone) .... Taper As Directed 11)  Biaxin 500 Mg Tabs (Clarithromycin) .... Take 1 Tablet By Mouth Two Times A Day X 7 Days  Allergies (verified): 1)  * Advair  Past History:  Past medical, surgical, family and social  histories (including risk factors) reviewed, and no changes noted (except as noted below).  Past Medical History: Reviewed history from 09/19/2007 and no changes required. 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   Family History: Reviewed history from 10/27/2007 and no changes required. sister with severe obstructive disease. o/w unremarkable per pt.  Social History: Reviewed history from 10/27/2007 and no changes required. 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  Review of Systems       The patient complains of shortness of breath with activity and productive cough.  The patient denies shortness of breath at rest, coughing up blood, chest pain, irregular heartbeats, acid heartburn, indigestion, loss of appetite, weight change, abdominal pain, difficulty swallowing, sore throat, tooth/dental problems, headaches, nasal congestion/difficulty breathing through nose, sneezing, itching, ear ache, anxiety, depression, hand/feet swelling, joint stiffness or pain, rash, change in color of mucus, and fever.    Vital Signs:  Patient profile:   50 year old male Height:      74 inches Weight:      181.38 pounds BMI:     23.37 O2 Sat:      95 % on Room air Temp:  98.1 degrees F oral Pulse rate:   69 / minute BP sitting:   116 / 84  (left arm) Cuff size:   regular  Vitals Entered By: Arman Filter LPN (September 14, 2009 9:53 AM)  O2 Flow:  Room air CC: Pt is here for a 4 month f/u appt.   Pt states he called the on-call dr on Sept 5 d/t increased coughing and is currently on abx and pred taper.  Pt states he is coughing up milky sputum and has increased sob with exertion.   Comments Medications reviewed with patient Arman Filter LPN  September 14, 2009 9:54 AM    Physical Exam  General:  thin male  in nad + cough with conversation and deep breathing. Nose:  no purulence noted, but erythematous Lungs:  scattered wheezing, but adequate airflow Heart:  rrr, no mrg Extremities:  no edema or cyanosis  Neurologic:  alert and oriented, moves all 4.   Impression & Recommendations:  Problem # 1:  SINUSITIS, ACUTE (ICD-461.9)  the pt has a long h/o chronic sinusitis with frequent acute flareups.  He is followed closely by ENT, and has had multiple surgeries.  His asthma  always flares up whenever he becomes infected.  Cough is usually his worse symptom, and requires aggressive treatment before it escalates to the syncope stage.  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 Taper as directed 2)  Biaxin 500 Mg Tabs (Clarithromycin) .... Take 1 tablet by mouth two times a day x 7 days 3)  Levaquin 750 Mg Tabs (Levofloxacin) .... One tablet by mouth daily 4)  Prednisone 10 Mg Tabs (Prednisone) .... Take as directed  Other Orders: Est. Patient Level IV ([address])  Patient Instructions: 1)  once you finish biaxin, start levaquin 750mg  one each day for another 7days 2)  change prednisone dosing to 4 each day for 2 days, then 3 each day for 2 days, then 2 each day for 2 days, then one each day for 2 days, then stop 3)  use cough suppression meds in the evening to control cough 4)  call if cough is worsening. 5)  folllowup with me in 4mos.  Prescriptions: PREDNISONE 10 MG  TABS (PREDNISONE) Take as directed  #30 x 0   Entered and Authorized by:   Barbaraann Share MD   Signed by:   Barbaraann Share MD on 09/14/2009   Method used:   Print then Give to Patient   RxID:   [id number] LEVAQUIN 750 MG  TABS (LEVOFLOXACIN) One tablet by mouth daily  #7 x 0   Entered and  Authorized by:   Barbaraann Share MD   Signed by:   Barbaraann Share MD on 09/14/2009   Method used:   Print then Give to Patient   RxID:   [id number]

## 2010-02-07 NOTE — Assessment & Plan Note (Signed)
Summary: Acute NP office visit - prod cough   Copy to:  [place] Primary Provider/Referring Provider:  Dr. Bebe Shaggy  CC:  prod cough with milky, sticky mucus, increased SOB, and wheezing x3days - denies f/c/s.  finished 10days avelox and pred taper given Shumaker on Tuesday.  would also like to discuss dosage change on celexa.  History of Present Illness: 50 yo with known hx of   November 18, 2009-Presents for an acute office visit. Complains of productive cough with milky, sticky mucus, increased SOB, wheezing x3days - Cough waxes and wanes. He recently  finished 10days avelox and pred taper given Shumaker on Tuesday.(3 days ago).  He never got totally better but had decreased cough for a while until last few days. No fever or discolored mucus. Denies chest pain,  orthopnea, hemoptysis, fever, n/v/d, edema, headache.   Medications Prior to Update: 1)  Ventolin Hfa 108 (90 Base) Mcg/act  Aers (Albuterol Sulfate) .Marland Kitchen.. 1-2 Puffs Every 4-6 Hours As Needed 2)  Prilosec 20 Mg  Cpdr (Omeprazole) .... Take 1 Tablet By Mouth Two Times A Day 3)  Dulera 100-5 Mcg/act Aero (Mometasone Furo-Formoterol Fum) .... Inhale 2 Puffs Two Times A Day 4)  Albuterol Sulfate (2.5 Mg/35ml) 0.083% Nebu (Albuterol Sulfate) .... Use 1 Vial in Nebulizer Every 4 Hours As Needed 5)  Meperidine Hcl 50 Mg Tabs (Meperidine Hcl) .... One Every 4-6 Hrs As Needed. 6)  Promethazine Hcl 25 Mg Tabs (Promethazine Hcl) .... One Every 4-6hrs As Needed. 7)  Neurontin 300 Mg Caps (Gabapentin) .... Take 1 Tablet By Mouth Two Times A Day 8)  Ativan 1 Mg Tabs (Lorazepam) .... Take By Mouth As Needed 9)  Celexa 20 Mg Tabs (Citalopram Hydrobromide) .... Take 1 Tablet By Mouth Once A Day 10)  Prednisone 10 Mg Tabs (Prednisone) .... Taper As Directed 11)  Biaxin 500 Mg Tabs (Clarithromycin) .... Take 1 Tablet By Mouth Two Times A Day X 7 Days 12)  Levaquin 750 Mg  Tabs (Levofloxacin) .... One Tablet By Mouth Daily 13)  Prednisone 10 Mg   Tabs (Prednisone) .... Take As Directed  Current Medications (verified): 1)  Ventolin Hfa 108 (90 Base) Mcg/act  Aers (Albuterol Sulfate) .Marland Kitchen.. 1-2 Puffs Every 4-6 Hours As Needed 2)  Prilosec 20 Mg  Cpdr (Omeprazole) .... Take 1 Tablet By Mouth Two Times A Day 3)  Dulera 100-5 Mcg/act Aero (Mometasone Furo-Formoterol Fum) .... Inhale 2 Puffs Two Times A Day 4)  Albuterol Sulfate (2.5 Mg/58ml) 0.083% Nebu (Albuterol Sulfate) .... Use 1 Vial in Nebulizer Every 4 Hours As Needed 5)  Meperidine Hcl 50 Mg Tabs (Meperidine Hcl) .... One Every 4-6 Hrs As Needed. 6)  Promethazine Hcl 25 Mg Tabs (Promethazine Hcl) .... One Every 4-6hrs As Needed. 7)  Neurontin 300 Mg Caps (Gabapentin) .... Take 1 Tablet By Mouth Two Times A Day 8)  Ativan 1 Mg Tabs (Lorazepam) .... Take By Mouth As Needed 9)  Celexa 20 Mg Tabs (Citalopram Hydrobromide) .... 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   Family History: Last updated: 10/27/2007 sister with severe obstructive disease. o/w unremarkable per pt.  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  Risk Factors: Smoking Status: quit (12/01/2007) Packs/Day: 1 ppd (10/04/2006)  Review of Systems      See HPI  Vital Signs:  Patient profile:   50 year old male Height:      74 inches Weight:      185.50 pounds BMI:     23.90 O2 Sat:      98 % on Room air Temp:     98.5 degrees F oral Pulse rate:   72 / minute BP sitting:   116 / 74  (left arm) Cuff size:   regular  Vitals Entered By: Boone Master CNA/MA (November 18, 2009 3:56 PM)  O2 Flow:  Room air CC: prod cough with milky, sticky mucus,  increased SOB, wheezing x3days - denies f/c/s.  finished 10days avelox and pred taper given Shumaker on Tuesday.  would also like to discuss dosage change on celexa Is Patient Diabetic? No Comments Medications reviewed with patient Daytime contact number verified with patient. Boone Master CNA/MA  November 18, 2009 3:56 PM    Physical Exam  Additional Exam:  GEN: A/Ox3; pleasant , NAD HEENT:  [place]/AT, , EACs-clear, TMs-wnl, NOSE-pale w/ clear discharge , THROAT-clear NECK:  Supple w/ fair ROM; no JVD; normal carotid impulses w/o bruits; no thyromegaly or nodules palpated; no lymphadenopathy. RESP  Coarse BS w/ no wheezing  CARD:  RRR, no m/r/g   GI:   Soft & nt; nml bowel sounds; no organomegaly or masses detected. 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  Delsym 2 tsp every 12h  Tessalon three times a day  May use meperdine as needed severe cough.  Phenergan as needed nausea Lorazepam as needed anxiety.  follow up 2 week Dr. Shelle Iron  work on trying to prevent cough , use sugarless candy, ice chips, no mints to help suppress cough and throat clearing.  Chlor tab 4mg  every 4 hr as needed nasal drip and throat clearing.  Please contact office for sooner follow up if symptoms do not improve or worsen  The following medications were removed from the medication list:    Biaxin 500 Mg Tabs (Clarithromycin) .Marland Kitchen... Take 1 tablet by mouth two times a day x 7 days    Levaquin 750 Mg Tabs (Levofloxacin) ..... One tablet by mouth daily His updated medication list for this problem includes:    Ventolin Hfa 108 (90 Base) Mcg/act Aers (Albuterol sulfate) .Marland Kitchen... 1-2 puffs every 4-6 hours as needed    Dulera 100-5 Mcg/act Aero (Mometasone furo-formoterol  fum) ..... Inhale 2 puffs two times a day    Albuterol Sulfate (2.5 Mg/61ml) 0.083% Nebu (Albuterol sulfate) ..... Use 1 vial in nebulizer every 4 hours as needed    Benzonatate 200 Mg Caps (Benzonatate) .Marland Kitchen... 1 by mouth three times a day as needed cough  Orders: T-2 View CXR (71020TC) Est. Patient Level IV (53299)  Medications Added to Medication List This Visit: 1)  Ativan 1 Mg Tabs (Lorazepam) .Marland Kitchen.. 1 by mouth two times a day as needed anxiety 2)  Prednisone 10 Mg Tabs (Prednisone) .... 4 tabs for 2 days, then 3 tabs for 2 days, 2 tabs for 2 days, then 1 tab for 2 days, then stop 3)  Benzonatate 200 Mg Caps (Benzonatate) .Marland Kitchen.. 1 by  mouth three times a day as needed cough 4)  Prednisone 10 Mg Tabs (Prednisone) .... 4 tabs for 2 days, then 3 tabs for 2 days, 2 tabs for 2 days, then 1 tab for 2 days, then stop  Complete Medication List: 1)  Ventolin Hfa 108 (90 Base) Mcg/act Aers (Albuterol sulfate) .Marland Kitchen.. 1-2 puffs every 4-6 hours as needed 2)  Prilosec 20 Mg Cpdr (Omeprazole) .... Take 1 tablet by mouth two times a day 3)  Dulera 100-5 Mcg/act Aero (Mometasone furo-formoterol fum) .... Inhale 2 puffs two times a day 4)  Albuterol Sulfate (2.5 Mg/64ml) 0.083% Nebu (Albuterol sulfate) .... Use 1 vial in nebulizer every 4 hours as needed 5)  Meperidine Hcl 50 Mg Tabs (Meperidine hcl) .... One every 4-6 hrs as needed. 6)  Promethazine Hcl 25 Mg Tabs (Promethazine hcl) .... One every 4-6hrs as needed. 7)  Neurontin 300 Mg Caps (Gabapentin) .... Take 1 tablet by mouth two times a day 8)  Ativan 1 Mg Tabs (Lorazepam) .Marland Kitchen.. 1 by mouth two times a day as needed anxiety 9)  Celexa 20 Mg Tabs (Citalopram hydrobromide) .... Take 1 tablet by mouth once a day 10)  Prednisone 10 Mg Tabs (Prednisone) .... 4 tabs for 2 days, then 3 tabs for 2 days, 2 tabs for 2 days, then 1 tab for 2 days, then stop 11)  Benzonatate 200 Mg Caps (Benzonatate) .Marland Kitchen.. 1 by mouth three times a day as needed cough 12)  Prednisone 10 Mg  Tabs (Prednisone) .... 4 tabs for 2 days, then 3 tabs for 2 days, 2 tabs for 2 days, then 1 tab for 2 days, then stop  Patient Instructions: 1)  Prednisone taper over next week.  2)  Delsym 2 tsp every 12h  3)  Tessalon three times a day  4)  May use meperdine as needed severe cough.  5)  Phenergan as needed nausea 6)  Lorazepam as needed anxiety.  7)  follow up 2 week Dr. Shelle Iron  8)  work on trying to prevent cough , use sugarless candy, ice chips, no mints to help suppress cough and throat clearing.  9)  Chlor tab 4mg  every 4 hr as needed nasal drip and throat clearing.  10)  Please contact office for sooner follow up if symptoms do not improve or worsen  Prescriptions: PREDNISONE 10 MG TABS (PREDNISONE) 4 tabs for 2 days, then 3 tabs for 2 days, 2 tabs for 2 days, then 1 tab for 2 days, then stop  #20 x 0   Entered and Authorized by:   Rubye Oaks NP   Signed by:   Rubye Oaks NP on 11/18/2009   Method used:   Electronically to        [facility] (retail)       [address]       [place], [place]  [address]       Ph: [phone]       Fax: [phone]   RxID:   [id number] BENZONATATE 200 MG CAPS (BENZONATATE) 1 by mouth three times a day as needed cough  #30 x 0   Entered and Authorized by:   Rubye Oaks NP   Signed by:   Rubye Oaks NP on 11/18/2009   Method used:   Electronically to        [facility])       [address]       [place], [place]  [address]       Ph: [phone]  Fax: [phone]   RxID:   [id number] PREDNISONE 10 MG TABS (PREDNISONE) 4 tabs for 2 days, then 3 tabs for 2 days, 2 tabs for 2 days, then 1 tab for 2 days, then stop  #20 x 0   Entered and Authorized by:   Rubye Oaks NP   Signed by:   Rubye Oaks NP on 11/18/2009   Method used:   Electronically to        [facility] (retail)       [address]       [place], [place]  [address]       Ph: [phone]       Fax: [phone]   RxID:   [id number] ATIVAN 1 MG TABS  (LORAZEPAM) 1 by mouth two times a day as needed anxiety  #30 x 0   Entered and Authorized by:   Rubye Oaks NP   Signed by:   Tammy Parrett NP on 11/18/2009   Method used:   Print then Give to Patient   RxID:   [id number] MEPERIDINE HCL 50 MG TABS (MEPERIDINE HCL) one every 4-6 hrs as needed.  #40 x 0   Entered and Authorized by:   Rubye Oaks NP   Signed by:   Tammy Parrett NP on 11/18/2009   Method used:   Print then Give to Patient   RxID:   [id number] PROMETHAZINE HCL 25 MG TABS (PROMETHAZINE HCL) one every 4-6hrs as needed.  #40 x 0   Entered and Authorized by:   Rubye Oaks NP   Signed by:   Tammy Parrett NP on 11/18/2009   Method used:   Print then Give to Patient   RxID:   [id number]    Immunization History:  Influenza Immunization History:    Influenza:  historical (10/08/2009)

## 2010-02-07 NOTE — Progress Notes (Signed)
Summary: speak to nurse  Phone Note Call from Patient Call back at Work Phone [phone]   Caller: Patient Call For: [facility] Summary of Call: Coughing continously, nothing seems to be working, please advise. Initial call taken by: Darletta Moll,  April 11, 2009 8:38 AM  Follow-up for Phone Call        called and spoke with pt.  pt states he recently saw [facility] on 03-31-2009 and states cough was improved.  Pt states he tapered off Meperdine and Promethazine but cough returned 4 days ago.  Pt states he is "up all night coughing and head hurts."  Pt states he hasn't passed out yet.  Pt restarted taking Promethazine and Meperdine to help with cough but has run out of these meds.  Will forward message to [facility] to address.  Aundra Millet Reynolds LPN  April 12, 8411 10:13 AM   Additional Follow-up for Phone Call Additional follow up Details #1::        ok to refill with same quantity as before, but may not be able to call in these meds.  He may need written prescription.  please check.  Pt says to use his home number when calling him today, which is [phone] Darletta Moll  April 12, 2009 9:04 AM  Additional Follow-up by: Barbaraann Share MD,  April 11, 2009 5:44 PM    Additional Follow-up for Phone Call Additional follow up Details #2::    called and spoke with pt.  pt aware rx faxed to [facility].  Aundra Millet Reynolds LPN  April 13, 7251 9:56 AM   Prescriptions: PROMETHAZINE HCL 25 MG TABS (PROMETHAZINE HCL) one every 4-6hrs as needed.  #40 x 0   Entered by:   Arman Filter LPN   Authorized by:   Barbaraann Share MD   Signed by:   Arman Filter LPN on 66/44/0347   Method used:   Printed then faxed to ...       [facility] (retail)       [address]       [place], [place]  [address]       Ph: [phone]       Fax: [phone]   RxID:   [id number] MEPERIDINE HCL 50 MG TABS (MEPERIDINE HCL) one every 4-6 hrs as needed.  #40 x 0   Entered by:   Arman Filter LPN   Authorized by:   Barbaraann Share  MD   Signed by:   Arman Filter LPN on 22/02/5425   Method used:   Printed then faxed to ...       [facility] (retail)       [address]       [place], [place]  [address]       Ph: [phone]       Fax: [phone]   RxID:   [id number]

## 2010-02-07 NOTE — Progress Notes (Signed)
Summary: VIOLENT COUGHING  Phone Note Call from Patient   Caller: Patient Call For: [facility] Summary of Call: PT C/O "VIOLENT COUGHING" X 3 DAYS, ESPECIALLY IN THE EVENING AND NIGHT. PT # [id number] Initial call taken by: Tivis Ringer, CNA,  November 18, 2009 12:15 PM  Follow-up for Phone Call        Spoke with pt.  He states that he was txed with 10 days of avelox and pred taper per ENT for sinus infection approx 2 wks ago, improved and then now cough violent cough x 3 days- prod with sticky, thick clear sputum.  Also c/o increased SOB with cough.  Appt was sched for this pm with TP at 4 okay per JJ.   Follow-up by: Vernie Murders,  November 18, 2009 12:38 PM

## 2010-02-07 NOTE — Miscellaneous (Signed)
Summary: change lexapro to celexa  Clinical Lists Changes  Medications: Removed medication of LEXAPRO 10 MG TABS (ESCITALOPRAM OXALATE) Take 1 tablet by mouth once a day Added new medication of CELEXA 20 MG TABS (CITALOPRAM HYDROBROMIDE) Take 1 tablet by mouth once a day - Signed Rx of CELEXA 20 MG TABS (CITALOPRAM HYDROBROMIDE) Take 1 tablet by mouth once a day;  #30 x 4;  Signed;  Entered by: Carron Curie CMA;  Authorized by: Barbaraann Share MD;  Method used: Telephoned to [facility] [address] [place], [place]  [address], Ph: [phone], Fax: [phone]    Prescriptions: CELEXA 20 MG TABS (CITALOPRAM HYDROBROMIDE) Take 1 tablet by mouth once a day  #30 x 4   Entered by:   Carron Curie CMA   Authorized by:   Barbaraann Share MD   Signed by:   Carron Curie CMA on 06/22/2009   Method used:   Telephoned to ...       [facility] (retail)       [address]       [place], [place]  [address]       Ph: [phone]       Fax: [phone]   RxID:   [id number]

## 2010-02-09 ENCOUNTER — Ambulatory Visit: Payer: Self-pay | Admitting: Internal Medicine

## 2010-02-09 ENCOUNTER — Ambulatory Visit: Admit: 2010-02-09 | Payer: Self-pay | Admitting: Internal Medicine

## 2010-02-09 NOTE — Assessment & Plan Note (Signed)
Summary: Pulmonary/ second opininion re chronic cough hfa 75% p coach   Copy to:  [facility] Bryler Dibble/Referring Ardie Mclennan:  Dr. Bebe Shaggy  CC:  Cough- worse x 5 months.  History of Present Illness: 49 yowm quit smoking around 1990 at dx of pneumonia requiring surgery at [facility] and chronic doe ever since but no cough until 2007  December 30, 2009 cc c/o onset 2007 comes and goes tends to be worse Feb > April and gone summer then back again starting in august > december, what's different now is cough so hards blacks out, cough worse in am > milkly thick junk wakes him up  all hours.  Has tried various inhalers and ppi rx and presently on prednisone > no better.  Pt denies any significant sore throat, dysphagia, itching, sneezing,  nasal congestion or excess secretions,  fever, chills, sweats, unintended wt loss, pleuritic or exertional cp, hempoptysis, change in activity tolerance (unless coughing actively)  orthopnea pnd or leg swelling.  Current Medications (verified): 1)  Ventolin Hfa 108 (90 Base) Mcg/act  Aers (Albuterol Sulfate) .Marland Kitchen.. 1-2 Puffs Every 4-6 Hours As Needed 2)  Prilosec 20 Mg  Cpdr (Omeprazole) .... Take 1 Tablet By Mouth Two Times A Day 3)  Dulera 200-5 Mcg/act Aero (Mometasone Furo-Formoterol Fum) .... 2 Puffs Every 12 Hrs 4)  Albuterol Sulfate (2.5 Mg/77ml) 0.083% Nebu (Albuterol Sulfate) .... Use 1 Vial in Nebulizer Every 4 Hours As Needed 5)  Neurontin 300 Mg Caps (Gabapentin) .... Take 1 Tablet By Mouth Two Times A Day 6)  Ativan 1 Mg Tabs (Lorazepam) .Marland Kitchen.. 1 By Mouth Two Times A Day As Needed Anxiety 7)  Celexa 20 Mg Tabs (Citalopram Hydrobromide) .... Take 1 Tablet By Mouth Once A Day 8)  Prednisone 20 Mg Tabs (Prednisone) .... 2 Once Daily 9)  Meperidine Hcl 50 Mg Tabs (Meperidine Hcl) .... 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 Abbe Amsterdam- [facility]on Lan EGD  per [facility] Nov 2011     - neg gastroparesis scan 2011  Family History: Reviewed history from 10/27/2007 and no changes required. sister with severe obstructive disease. o/w unremarkable per pt.  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 Nl external ear canals without cough reflex NECK :  without JVD/Nodes/TM/ nl carotid upstrokes bilaterally LUNGS: no acc muscle use, clear to A and P bilaterally without cough on insp or exp maneuvers CV:  RRR  no s3 or murmur or increase in P2, no  edema  ABD:  soft and nontender with nl excursion in the supine position. No bruits or organomegaly, bowel sounds nl MS:  warm without deformities, calf tenderness, cyanosis or clubbing SKIN: warm and dry without lesions   NEURO:  alert, approp, no deficits     CXR  Procedure date:  11/18/2009  Findings:      wnl  Impression & Recommendations:  Problem # 1:  COUGH (ICD-786.2) The most common causes of chronic cough in immunocompetent adults include: upper airway cough syndrome (UACS), previously referred to as postnasal drip syndrome,  caused by variety of rhinosinus conditions; (2) asthma; (3) GERD; (4) chronic bronchitis from cigarette smoking or  other inhaled environmental irritants; (5) nonasthmatic eosinophilic bronchitis; and (6) bronchiectasis. These conditions, singly or in combination, have accounted for up to 94% of the causes of chronic cough in prospective studies.  This cough has mostly features of uacs/ cyclical pattern as suggested by Dr Shelle Iron but he does feel better p albuterol and did not demonstrate adequate technique for use of hfa (see # 2) and now is coughing so violently he's passing out so certainly could have secondary reflux driving the cough via a cyclical mechanism.  Discussed with pt: Unlike when you get a prescription for eyeglasses, it's not possible to always walk out of this or any medical office with a perfect prescription that is immediately effective  based on any test that we offer here.  On the contrary, it may take several weeks for the full impact of changes recommened today - hopefully you will respond well.  If not, then we'll adjust your medication on your next visit accordingly, knowing more then than we can possibly know now.     And: The standardized cough guidelines recently published in Chest are a 14 step process, not a single office visit,  and are intended  to address this problem logically,  with an alogrithm dependent on response to each progressive step  to determine a specific diagnosis with  minimal addtional testing needed. Therefore if compliance is an issue this empiric standardized approach simply won't work.  Therefore needs to bring all active meds back on each ov so we can verify exactly what he is taking before making additional recs.  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  This improved from  25-75% with coaching   Medications Added to Medication List This Visit: 1)  Dulera 100-5 Mcg/act Aero (Mometasone furo-formoterol fum) .... 2 puffs first thing  in am and 2 puffs again in pm about 12 hours  later 2)  Dulera 200-5 Mcg/act Aero (Mometasone furo-formoterol fum) .... 2 puffs every 12 hrs 3)  Prilosec 20 Mg Cpdr (Omeprazole) .... Take one 30-60 min before first and last meals of the day 4)  Prednisone 20 Mg Tabs (Prednisone) .... 2 once daily 5)  Prednisone 20 Mg Tabs (Prednisone) .Marland Kitchen.. 1 daily at breakfast 6)  Pepcid 20 Mg Tabs (Famotidine) .... Take one by mouth at bedtime 7)  Tramadol Hcl 50 Mg Tabs (Tramadol hcl) .... One to two by mouth every 4-6 hours as needed to supplement the delsym  Complete Medication List: 1)  Dulera 100-5 Mcg/act Aero (Mometasone furo-formoterol fum) .... 2 puffs first thing  in am and 2 puffs again in pm about 12 hours later 2)  Prilosec 20 Mg Cpdr (Omeprazole) .... Take one 30-60 min before first and last meals of the day 3)  Neurontin 300 Mg Caps (Gabapentin) .... Take 1 tablet by mouth two times a day 4)  Celexa 20 Mg Tabs (Citalopram hydrobromide) .... Take 1 tablet by mouth once a day 5)  Prednisone 20 Mg Tabs (Prednisone) .Marland Kitchen.. 1 daily at breakfast 6)  Pepcid 20 Mg Tabs (Famotidine) .... Take one by mouth at bedtime 7)  Ventolin Hfa 108 (90 Base) Mcg/act Aers (Albuterol sulfate) .Marland Kitchen.. 1-2 puffs every 4-6 hours as needed 8)  Albuterol Sulfate (2.5 Mg/39ml) 0.083% Nebu (Albuterol sulfate) .... Use 1 vial in nebulizer every 4 hours as needed 9)  Ativan 1 Mg Tabs (Lorazepam) .Marland Kitchen.. 1 by mouth two times a day as needed anxiety 10)  Tramadol Hcl 50 Mg Tabs (Tramadol hcl) .... One to two by mouth every 4-6 hours as needed to supplement the delsym 11)  Meperidine Hcl 50 Mg Tabs (Meperidine hcl) .... One every 4-6 hours as needed for cough suppression  Other Orders: T-Allergy Profile Region II-[place], [place], [place], , [place] (5484) Est. Patient Level V [phone]) HFA Instruction [id number]) TLB-CBC Platelet - w/Differential (85025-CBCD)  Patient Instructions: 1)  Change omeprazole to Take one 30-60 min before first and last meals of the day and add Pepcid 20 mg at bedtime 2)   Change dulera 100 2 puffs first thing  in am and 2 puffs again in pm about 12 hours later and work on smooth deep breath and out through nose 3)  Reduce prednisone to 20 mg each am  4)  Take delsym two tsp every 12 hours and add tramadol 50 mg up to every 4 hours to suppress the urge to cough. Swallowing water or using ice chips/non mint and menthol containing candies (such as lifesavers or sugarless jolly ranchers) are also effective.  5)  GERD (REFLUX)  is a common cause of respiratory symptoms. It commonly presents without heartburn and can be treated with medication, but also with lifestyle changes including avoidance of late meals, excessive alcohol, smoking cessation, and avoid fatty foods, chocolate, peppermint, colas, red wine, and acidic juices such as orange juice. NO MINT OR MENTHOL PRODUCTS SO NO COUGH DROPS  6)  USE SUGARLESS CANDY INSTEAD (jolley ranchers)  7)  NO OIL BASED VITAMINS  8)  Please schedule a follow-up appointment in 2 weeks, sooner if needed  with all medications in hand 9)    Prescriptions: TRAMADOL HCL 50 MG  TABS (TRAMADOL HCL) One to two by mouth every 4-6 hours as needed to supplement the delsym  #40 x 0   Entered and Authorized by:   Nyoka Cowden MD   Signed by:   Nyoka Cowden MD on 12/30/2009   Method used:   Electronically to        [facility]* (retail)       [address]       [place], [place]  [address]       Ph: [phone]       Fax: [phone]   RxID:   [id number]

## 2010-02-09 NOTE — Progress Notes (Signed)
Summary: cough syncope/ pt scheduled with dr. Sherene Sires 12.23  Phone Note Call from Patient Call back at [phone]   Caller: Patient Call For: clance Summary of Call: wants to speak about cough syncope Initial call taken by: Lacinda Axon,  December 28, 2009 8:23 AM  Follow-up for Phone Call        Pt states that the cough has improved slightly on 40mg  of Prednisone daily and since he finished abx. He now says that not only does he "pass out" at night but this is becoming more frequent during the day and is affecting his job. He says he has not used any smokeless tobacco. Pls advise.Ralph Hill [facility]  December 28, 2009 8:56 AM  Additional Follow-up for Phone Call Additional follow up Details #1::        he was supposed to see me this week to check on progress...apparently he rescheduled?  let him know I am leaving town in am...we need to get his prednisone tapered if it isn't making a difference.  He needs ov with someone this week. Additional Follow-up by: Barbaraann Share MD,  December 28, 2009 4:57 PM    Additional Follow-up for Phone Call Additional follow up Details #2::    CALLED AND Spoke with pt's wife and he is coming in on 12/23 to see dr. Toney Sang  December 28, 2009 5:03 PM

## 2010-02-13 ENCOUNTER — Encounter (HOSPITAL_COMMUNITY)
Admission: RE | Admit: 2010-02-13 | Discharge: 2010-02-13 | Disposition: A | Discharge status: Home / Self Care | Payer: 59 | Source: Ambulatory Visit | Attending: Otolaryngology | Admitting: Otolaryngology

## 2010-02-13 DIAGNOSIS — Z01812 Encounter for preprocedural laboratory examination: Secondary | ICD-10-CM | POA: Insufficient documentation

## 2010-02-13 DIAGNOSIS — Z01810 Encounter for preprocedural cardiovascular examination: Secondary | ICD-10-CM | POA: Insufficient documentation

## 2010-02-13 DIAGNOSIS — J321 Chronic frontal sinusitis: Secondary | ICD-10-CM | POA: Insufficient documentation

## 2010-02-13 LAB — CBC
HCT: 49.2 % (ref 39.0–52.0)
MCHC: 33.9 g/dL (ref 30.0–36.0)
Platelets: 266 10*3/uL (ref 150–400)
RDW: 14.2 % (ref 11.5–15.5)
WBC: 10.4 10*3/uL (ref 4.0–10.5)

## 2010-02-13 LAB — BASIC METABOLIC PANEL
BUN: 9 mg/dL (ref 6–23)
Calcium: 9.8 mg/dL (ref 8.4–10.5)
Chloride: 101 mEq/L (ref 96–112)
GFR calc Af Amer: >60 mL/min (ref >60)
Potassium: 4.6 mEq/L (ref 3.5–5.1)
Sodium: 143 mEq/L (ref 135–145)

## 2010-02-13 LAB — SURGICAL PCR SCREEN
MRSA, PCR: NEGATIVE
Staphylococcus aureus: NEGATIVE

## 2010-02-14 ENCOUNTER — Other Ambulatory Visit: Payer: Self-pay | Admitting: Otolaryngology

## 2010-02-14 ENCOUNTER — Ambulatory Visit (HOSPITAL_COMMUNITY)
Admission: RE | Admit: 2010-02-14 | Discharge: 2010-02-15 | Disposition: A | Discharge status: Home / Self Care | Payer: 59 | Source: Ambulatory Visit | Attending: Otolaryngology | Admitting: Otolaryngology

## 2010-02-14 DIAGNOSIS — J330 Polyp of nasal cavity: Secondary | ICD-10-CM | POA: Insufficient documentation

## 2010-02-14 DIAGNOSIS — Z01812 Encounter for preprocedural laboratory examination: Secondary | ICD-10-CM | POA: Insufficient documentation

## 2010-02-14 DIAGNOSIS — Z01810 Encounter for preprocedural cardiovascular examination: Secondary | ICD-10-CM | POA: Insufficient documentation

## 2010-02-14 DIAGNOSIS — J984 Other disorders of lung: Secondary | ICD-10-CM | POA: Insufficient documentation

## 2010-02-14 DIAGNOSIS — J321 Chronic frontal sinusitis: Secondary | ICD-10-CM | POA: Insufficient documentation

## 2010-02-16 LAB — CULTURE, ROUTINE-SINUS

## 2010-02-19 LAB — ANAEROBIC CULTURE

## 2010-02-24 NOTE — Op Note (Signed)
NAME:  KHRIZ, LIDDY                ACCOUNT NO.:  [id number]  MEDICAL RECORD NO.:  [id number]           PATIENT TYPE:  O  LOCATION:  5126                         FACILITY:  [facility]  PHYSICIAN:  Kinnie Scales. Annalee Genta, M.D.DATE OF BIRTH:  [birth year]  DATE OF PROCEDURE:  02/14/2010 DATE OF DISCHARGE:                              OPERATIVE REPORT   PREOPERATIVE DIAGNOSES: 1. Chronic frontal sinusitis. 2. Chronic nasal polyposis. 3. Pulmonary dysfunction. 4. Status post bilateral endoscopic sinus surgery performed in     December 2009 and in July 2008.  POSTOPERATIVE DIAGNOSES: 1. Chronic frontal sinusitis. 2. Chronic nasal polyposis. 3. Pulmonary dysfunction. 4. Status post bilateral endoscopic sinus surgery performed in     December 2009 and in July 2008.  INDICATIONS FOR SURGERY: 1. Chronic frontal sinusitis. 2. Chronic nasal polyposis. 3. Pulmonary dysfunction. 4. Status post bilateral endoscopic sinus surgery performed in December 2009 and in July 2008.  SURGICAL PROCEDURE:  Bilateral total ethmoidectomy with nasal and sinus polypectomy, bilateral nasal frontal recess exploration, right frontal sinus to rule out procedure, intraoperative computer-assisted navigation (Stealth)  SURGEON:  Kinnie Scales. Annalee Genta, MD  ANESTHESIA:  General endotracheal.  COMPLICATIONS:  None.  ESTIMATED BLOOD LOSS:  Approximately 100 mL.  Specimens were sent to pathology and microbiology for culture and Gram stain.  A separate culture was sent to pathology for analysis for allergic fungal mucin.  The patient transferred from the operating room to the recovery room in stable condition.  BRIEF HISTORY:  Mr. Wherley is a 50 year old white male who has been followed with a longstanding history of nasal polyposis, chronic sinusitis, and pulmonary dysfunction manifested a severe progressive cough associated with increased sinus symptoms.  The patient underwent previous endoscopic sinus surgery in  December 2009, that has been stable and doing well until the last 2 months when he began to develop progressive symptoms of frontal sinus headache and progressive nasal congestion.  He was treated with several broad-spectrum antibiotics as well as two rounds of oral prednisone to try to reduce inflammation.  A CT scan was obtained, which showed opacification of the frontal sinuses. The patient had no significant improvement in symptoms with antibiotic and steroid therapy and given his history, examination and findings, which showed polypoid disease in the superior ethmoid cavities bilaterally.  I recommended to undertake revision endoscopic sinus surgery.  Risks, benefits and possible complications of the procedure were discussed in detail with the patient and family.  They understood and concurred with our plan for surgery, which is scheduled under general anesthesia at [facility] OR.  Prior to surgery, the patient underwent a Stealth formatted CT scan of the sinuses to facilitate intraoperative computer-assisted navigation, which was used throughout the sinus component of this surgery.  SURGICAL PROCEDURES:  The patient was brought to the operating room at [facility] OR on February 14, 2010, and placed in supine position on the operating table.  General endotracheal anesthesia was established without difficulty.  When the patient was adequately anesthetized, he was positioned on the operating table and prepped and draped in sterile fashion.  The patient's nose was  injected with a total of 5 mL of 1% lidocaine with 1:100,000 solution epinephrine was injected in submucosal fashion along the lateral nasal wall, middle turbinate and intranasal polyps bilaterally.  The patient's nose was then packed with Afrin-soaked cottonoid pledget, which was left in place for approximately 10 minutes for allowing vasoconstriction and hemostasis. The Stealth navigation headgear  was applied and anatomic and surgical landmarks were identified and confirmed.  The Stealth device was used throughout the surgical procedure particularly in the frontal sinus to allow component of the operation.  With the patient positioned and prepped for surgery, endoscopic sinus surgery was begun on the patient's left-hand side, 0-degree endoscopy was performed.  There was a moderate amount of polypoid material filling the middle meatus.  On the left-hand side, the middle turbinate was intact and the left maxillary ostium appeared patent without evidence of infection or significant discharge.  Using a 0-degree telescope and a straight microdebrider, polypoid disease was resected from the ethmoid cavity, removing all soft tissue and creating a widely patent inferior ethmoid region.  The dissection was then carried posteriorly, posterior ethmoid air cells were identified.  Using a 45-degree telescope, dissection was carried from posterior to anterior along the roof of the ethmoid removing polypoid disease and some residual bony septations along the roof of the ethmoid sinus.  The nasal frontal recess region was identified.  This was occluded by underlying ethmoid tissue, polypoid disease, and soft tissue swelling.  Again using the navigation tool to locate the natural ostia and the maxillary sinus, sinus seeker was inserted and the sinus ostium was enlarged.  Within the left maxillary sinus, there was thick mucinous debris, which was completely suctioned and irrigated.  This tissue was sent to pathology for evaluation of allergic fungal mucin after culture and sensitivity for aerobic, anaerobic, and fungal infection.  Attention was then turned to the patient's right-hand side where 0- degree nasal endoscopy was performed.  The patient had significant polypoid disease within the common ethmoid cavity.  This was resected using a straight microdebrider, posterior ethmoid air cells  were identified and dissection was then carried out from posterior to anterior along the roof of the ethmoid sinus using a 45-degree telescope and a curved microdebrider.  The navigation tool was used for anatomic localization throughout the procedure.  At the level of nasal frontal recess, the ostium was completely occluded with polypoid disease, scar tissue, and inflamed mucosa.  The frontal sinus balloon sinuplasty technique was then used, the wire-guided catheter was passed into the frontal sinus.  The balloon catheter was inserted over the wire guide and inflated to enlarge the natural frontal sinus ostium.  The sinus was then irrigated copiously with 40 mL of sterile saline and the guide catheter was removed.  Despite adequate opening, the patient had significant polypoid disease and obstruction within the frontal sinus and frontal sinus recess.  Using a curved microdebrider, polypoid material was removed.  Again given the chronic nature of the patient's symptoms, concerns were raised regarding risk of stenosis of the frontal sinus and a frontal sinus drill-out procedure was opted.  With adequate visualization of the patient's frontal sinus ostium, a curved frontal sinus drill bur was used.  Bony obstruction includingosteitic thickened bone from previous infection was then carefully removed along the anterior aspect of the nasal frontal recess. Dissection was also carried slightly medially to enlarge the ostium and allow for maximal drainage.  Sinus was again carefully inspected and irrigated, some polypoid mucosa within the sinus  was removed, but attention was made to the natural ostium and its mucosal line, we attempted to preserve as much of the mucosa as possible.  Again, thick mucinous debris was irrigated from within the right frontal sinus.  With the sinus component of the procedure completed, the patient's nasal cavity was inspected.  There was minimal bleeding.  The  sinuses were irrigated, surgical debris was resected and the sinuses were then instilled with a 50:50 mixture of Kenalog 40 and Bactroban cream, which was used to create a slurry, which was instilled within the frontal and ethmoid sinuses bilaterally.  On the left hand side, the middle turbinate had a tendency to lateralize partially occluding the anterior ethmoid recess.  The anterior aspect of middle turbinate was then resected to allow long-term access to this region.  The patient's oral cavity and oropharynx were irrigated and suctioned.  Orogastric tube was passed.  Stomach contents were aspirated.  The patient was then awakened from his anesthetic.  He was extubated and was transferred from the operating room to the recovery room in stable condition.  No packing was placed within the nasal cavity.          ______________________________ Kinnie Scales Annalee Genta, M.D.     DLS/MEDQ  D:  04/54/0981  T:  02/15/2010  Job:  [id number]  Electronically Signed by Osborn Coho M.D. on 02/24/2010 04:49:42 PM

## 2010-03-29 LAB — COMPREHENSIVE METABOLIC PANEL
ALT: 21 U/L (ref 0–53)
Albumin: 3.5 g/dL (ref 3.5–5.2)
Alkaline Phosphatase: 100 U/L (ref 39–117)
BUN: 7 mg/dL (ref 6–23)
Calcium: 8.9 mg/dL (ref 8.4–10.5)
Creatinine, Ser: 1.05 mg/dL (ref 0.4–1.5)
Total Protein: 6.7 g/dL (ref 6.0–8.3)

## 2010-03-29 LAB — CBC
Hemoglobin: 13.1 g/dL (ref 13.0–17.0)
MCV: 88.8 fL (ref 78.0–100.0)
Platelets: 229 10*3/uL (ref 150–400)
RBC: 4.44 MIL/uL (ref 4.22–5.81)
WBC: 7.7 10*3/uL (ref 4.0–10.5)

## 2010-03-29 LAB — PROTIME-INR: Prothrombin Time: 13.2 seconds (ref 11.6–15.2)

## 2010-03-29 LAB — APTT: aPTT: 25 seconds (ref 24–37)

## 2010-03-29 LAB — MAGNESIUM: Magnesium: 2.6 mg/dL — ABNORMAL HIGH (ref 1.5–2.5)

## 2010-04-05 ENCOUNTER — Telehealth: Payer: Self-pay | Admitting: Internal Medicine

## 2010-04-05 NOTE — Telephone Encounter (Signed)
Per KC, pt will either need OV or Urgent Care.  Called, spoke with pt.  He is aware he will need to schedule OV or go to Urgent Care - nothing can be given over the phone.  He has not been seen in office since Dec.  Pt states he is unable to schedule appt at this time.  Requesting to call back for this.  Advised Urgent Care if he is unable to schedule appt here.  He verbalized understanding.

## 2010-04-05 NOTE — Telephone Encounter (Signed)
Called, spoke with pt.  He c/o prod cough with clear mucus, wheezing, chest tightness x 1 wk.  Also using neb and rescue HFA more frequently.  Offered OV but pt states he is unable to come in this week.  He is requesting something be called in to [facility] in [place].  Allergies verified.  Dr. Sherene Sires out of office this evening, Dr. Shelle Iron - pls advise.   **Note:  Pt has seen Dr. Shelle Iron in the past.  Allergies  Allergen Reactions  . Advair Hfa     REACTION: (question of)

## 2010-04-06 ENCOUNTER — Telehealth: Payer: Self-pay | Admitting: Pulmonary Disease

## 2010-04-06 MED ORDER — PREDNISONE 10 MG PO TABS: ORAL_TABLET | ORAL | Status: DC

## 2010-04-06 NOTE — Telephone Encounter (Signed)
Called and spoke with pt's wife, Ralph Hill.  Ralph Hill states pt has seen Dr. Shelle Iron in the past but switched to Dr. Sherene Sires. Pt last saw Dr. Sherene Sires in Dec 2012.  Ralph Hill states pt was recently seen by Dr. Annalee Genta and had sinus surgery in Feb.  Dr. Annalee Genta had given pt a pred taper recently along with a rx for nasal neb as pt was startin to cough again d/t the pollen/allergies.  Pt's wife states the pred taper helped cough but pt has since finished the pred taper and cough is returning and Dr. Gonzella Lex office will not refill pred taper and was told to call our office for rx.  Ralph Hill states pt has been dx with cough syncope and she is concerned about pt's cough getting worse to where pt will pass out.  Ralph Hill is requesting rx for pred taper.  Please advise.  Thanks.  MW and [facility] out of office this afternoon.  Will forward message to doc of the day to address.   Allergies: advair.

## 2010-04-06 NOTE — Telephone Encounter (Signed)
Per CY---ok for pt to have pred taper---#20   4 x 2 days, 3 x 2 days, 2 x 2 days, 1 x 2 days then stop with no refills.  This has been sent in to the pharmacy--[facility]---called and spoke with pts wife and she is aware of meds sent in per CY---told her to have pt follow up in the office and they will..she did state that pt has follow up scheduled with Dr. Annalee Genta for his sinus issues

## 2010-04-11 ENCOUNTER — Encounter: Payer: Self-pay | Admitting: Internal Medicine

## 2010-04-11 DIAGNOSIS — J45909 Unspecified asthma, uncomplicated: Secondary | ICD-10-CM | POA: Insufficient documentation

## 2010-04-12 ENCOUNTER — Encounter: Payer: Self-pay | Admitting: Internal Medicine

## 2010-04-12 ENCOUNTER — Ambulatory Visit (INDEPENDENT_AMBULATORY_CARE_PROVIDER_SITE_OTHER)
Admission: RE | Admit: 2010-04-12 | Discharge: 2010-04-12 | Disposition: A | Discharge status: Home / Self Care | Payer: 59 | Source: Ambulatory Visit | Attending: Internal Medicine | Admitting: Internal Medicine

## 2010-04-12 ENCOUNTER — Ambulatory Visit (INDEPENDENT_AMBULATORY_CARE_PROVIDER_SITE_OTHER): Payer: 59 | Admitting: Internal Medicine

## 2010-04-12 VITALS — BP 124/82 | HR 96 | Temp 98.6°F | Ht 74.0 in | Wt 175.8 lb

## 2010-04-12 DIAGNOSIS — R059 Cough, unspecified: Secondary | ICD-10-CM

## 2010-04-12 DIAGNOSIS — R05 Cough: Secondary | ICD-10-CM

## 2010-04-12 DIAGNOSIS — J45909 Unspecified asthma, uncomplicated: Secondary | ICD-10-CM

## 2010-04-12 MED ORDER — TRAMADOL HCL 50 MG PO TABS: ORAL_TABLET | ORAL | Status: DC

## 2010-04-12 NOTE — Patient Instructions (Signed)
Dulera 100 Take 2 puffs first thing in am and then another 2 puffs about 12 hours later.   Work on inhaler technique:  relax and gently blow all the way out then take a nice smooth deep breath back in, triggering the inhaler at same time you start breathing in.  Hold for up to 5 seconds if you can.  Rinse and gargle with water when done   If your mouth or throat starts to bother you,   I suggest you time the inhaler to your dental care and after using the inhaler(s) brush teeth and tongue with a baking soda containing toothpaste and when you rinse this out, gargle with it first to see if this helps your mouth and throat.     Omeprazole 20 mg   Take 2 pills  30 -60 min before 1st and last meal  And continue pepcid 20 mg one at bedtime  Take delsym two tsp every 12 hours and supplement if needed with  tramadol 50 mg up to 2 every 4 hours to suppress the urge to cough. Swallowing water or using ice chips/non mint and menthol containing candies (such as lifesavers or sugarless jolly ranchers) are also effective.  You should rest your voice and avoid activities that you know make you cough.  Once you have eliminated the cough for 3 straight days try reducing the tramadol first,  then the delsym as tolerated.    Return in 2 weeks with all medications in hand  Unlike when you get a prescription for eyeglasses, it's not possible to always walk out of this or any medical office with a perfect prescription that is immediately effective  based on any test that we offer here.    On the contrary, it may take several weeks for the full impact of changes recommened today - hopefully you will respond well.  If not, then we'll adjust your medication on your next visit accordingly, knowing more then than we can possibly know now.

## 2010-04-12 NOTE — Assessment & Plan Note (Signed)
DDX of  difficult airways managment all start with A and  include Adherence, Ace Inhibitors, Acid Reflux, Active Sinus Disease, Alpha 1 Antitripsin deficiency, Anxiety masquerading as Airways dz,  ABPA,  allergy(esp in young), Aspiration (esp in elderly), Adverse effects of DPI,  Active smokers, plus two Bs  = Bronchiectasis and Beta blocker use..and one C= CHF   Adherence is always the initial "prime suspect" and is a multilayered concern that requires a "trust but verify" approach in every patient - starting with knowing how to use medications, especially inhalers, correctly, keeping up with refills and understanding the fundamental difference between maintenance and prns vs those medications only taken for a very short course and then stopped and not refilled.   The proper method of use, as well as anticipated side effects, of this metered-dose inhaler are discussed and demonstrated to the patient. Improved to 75% with repeated coaching  Acid reflux also a concern:  Reviewed optimal dosing  Active sinusitis: followed in [place]

## 2010-04-12 NOTE — Progress Notes (Signed)
  Subjective:    Patient ID: Ralph Hill, male    DOB: [birth year], 50 y.o.   MRN: [id number]  HPI  49 yowm quit smoking around 1990 at dx of pneumonia requiring surgery at [facility] and chronic doe ever since but no cough until 2007   December 30, 2009 cc c/o  Cough onset 2007 comes and goes tends to be worse Feb > April and gone summer then back again starting in august > december, what's different now is cough so hards blacks out, cough worse in am > milkly thick junk wakes him up all hours. Has tried various inhalers and ppi rx and presently on prednisone > no better.  rec dulera (poor technique)  and ppi and diet > much better but then got sinus infection required surgery by Annalee Genta  04/12/2010  Ov cc persistent cough x 2 weeks  only better on high doses of prednisone last pred 4/3 over the phone,  Mostly mucoid minimal amt seems worse p 3 pm better from thing in am until around 3pm.  Keeps up at night. Not able to use hfa and now neb depependent though doesn't know what he puts in it.  Very confused with maint vs prns  Pt denies any significant sore throat, dysphagia, itching, sneezing,  nasal congestion or excess/ purulent secretions,  fever, chills, sweats, unintended wt loss, pleuritic or exertional cp, hempoptysis, orthopnea pnd or leg swelling.    Also denies any obvious fluctuation of symptoms with weather or environmental changes or other aggravating or alleviating factors.     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 Abbe Amsterdam- [facility]  EGD per [facility] Nov 2011  - neg gastroparesis scan 2011    Family History:  Reviewed history from 10/27/2007 and no changes required.  sister with severe  obstructive disease.  o/w unremarkable per pt.     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         Review of Systems     Objective:   Physical Exam     anxious and depressed amb wm nad  wt 186 > 181 December 30, 2009 > 175 04/12/2010  HEENT: nl dentition, turbinates, and orophanx. Nl external ear canals without cough reflex  NECK : without JVD/Nodes/TM/ nl carotid upstrokes bilaterally  LUNGS: no acc muscle use, clear to A and P bilaterally without cough on insp or exp maneuvers  CV: RRR no s3 or murmur or increase in P2, no edema  ABD: soft and nontender with nl excursion in the supine position. No bruits or organomegaly, bowel sounds nl  MS: warm without deformities, calf tenderness, cyanosis or clubbing        Assessment & Plan:

## 2010-04-12 NOTE — Assessment & Plan Note (Signed)
   Each maintenance medication was reviewed in detail including most importantly the difference between maintenance and as needed and under what circumstances the prns are to be used.  Please see instructions for details which were reviewed in writing and the patient given a copy.   

## 2010-04-13 ENCOUNTER — Telehealth: Payer: Self-pay | Admitting: *Deleted

## 2010-04-13 NOTE — Telephone Encounter (Signed)
Spoke w/ dana pt wife and informed her of pt cxr results since pt was sleeping. Pt wife states she will advise pt of these results and needed nothing else further

## 2010-04-17 ENCOUNTER — Telehealth: Payer: Self-pay | Admitting: Internal Medicine

## 2010-04-17 NOTE — Telephone Encounter (Signed)
Spoke w/ pt and she states pt is currently ay [facility] and is going to be transported to [facility] center as we speak. Pt states pt currently has chicken pox and the doctors think that it may have spread into his chest. PT is wanting his last cxr done on wed faxed over. Wife is to call back w/ attn and fax # that cxr needs to be faxed too. Will await wife call back

## 2010-04-17 NOTE — Telephone Encounter (Signed)
Patients wife called back fax number is [phone] attn Dr Enid Skeens ICCU 3 at [facility]. Dr Enid Skeens stated that any recent office notes would be good. She can be reached at [phone]

## 2010-04-17 NOTE — Telephone Encounter (Signed)
Pt's spouse requesting that pt's last OV note and CXR report be faxed to Dr. Nanine Means at [facility] at [phone]. Spouse states pt may have pneumonia. Also ID is being called in to evaluate pt. I will forward to Dr. Sherene Sires so he is aware and may call the spouse.

## 2010-04-17 NOTE — Telephone Encounter (Signed)
Ok to send last ov and cxr report

## 2010-04-18 NOTE — Telephone Encounter (Signed)
Already printed and faxed last ov and cxr

## 2010-04-28 ENCOUNTER — Telehealth: Payer: Self-pay | Admitting: Pulmonary Disease

## 2010-04-28 NOTE — Telephone Encounter (Signed)
Returning call.

## 2010-04-28 NOTE — Telephone Encounter (Signed)
Spoke w/ Annabelle Harman pt wife and she states int he beginning of April they though pt had chicken pox but turns out he just had a bad case of hives. This is what [facility] informed them. 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 Pt wife states they are doing a lot of test on pt and is sending him to an allergist to be tested. Pt wife states they are going to hold off on see Dr. Shelle Iron for right now until they can find out what is going on with pt. Wife states the infectious disease doctor informed them that the :farmer's lung" may be what is causing his cough. Annabelle Harman just wanted to send this to Dr. Shelle Iron as an fyi. Please advise KC, Thanks  Carver Fila, CMA

## 2010-04-28 NOTE — Telephone Encounter (Signed)
Spoke w. Annabelle Harman and advised her that if pt is still having problems once they figure out what's going on he will be willing to see him. Annabelle Harman states they are going to get al records for pt from [facility] and wants to wait until the allergists see's pt so they can send those records to [facility] as well.

## 2010-04-28 NOTE — Telephone Encounter (Signed)
Complex situation but that may explain all of his problems and why he gets better on prednisone!  Let them handle all this in one center [facility]) and see [place] back when they are done if still having problems ( would need all records at that point though)

## 2010-04-28 NOTE — Telephone Encounter (Signed)
Will forward to Dr. Sherene Sires then so he is aware

## 2010-04-28 NOTE — Telephone Encounter (Signed)
I no longer see pt.  He changed over to Dr Sherene Sires.  My two cents though is all his cxr's/ct chest have not been c/w diagnosis of "farmer's lung"

## 2010-04-28 NOTE — Telephone Encounter (Signed)
lmomtcb x1 

## 2010-05-02 ENCOUNTER — Ambulatory Visit: Payer: 59 | Admitting: Internal Medicine

## 2010-05-08 ENCOUNTER — Other Ambulatory Visit: Payer: Self-pay | Admitting: Pulmonary Disease

## 2010-05-16 ENCOUNTER — Telehealth: Payer: Self-pay | Admitting: *Deleted

## 2010-05-16 NOTE — Telephone Encounter (Signed)
Needs ov asap with all meds in hand (including all inhalers/ nebs etc)

## 2010-05-16 NOTE — Telephone Encounter (Signed)
Pt's spouse is aware and will call if anything is needed from our office. She will have all doctors send correspondence to file in pt's chart.

## 2010-05-16 NOTE — Telephone Encounter (Signed)
Results received and given to Dr. Sherene Sires to address.

## 2010-05-16 NOTE — Telephone Encounter (Signed)
That is perfect, getting all the docs together under one roof He should still take all active meds with him so they can sort things out  - return here only if not satisfied with symptom control when treatment complete with records in hand if our input desired.  Good luck!

## 2010-05-16 NOTE — Telephone Encounter (Signed)
Called, spoke with pt.  He was informed MW would like him to come in with all meds to discuss sputum test results done at [facility].  Pt states he was in [facility] for 7 days.  States they have him scheduled to see a pulmonary dr in [place] on 05/23/10.  He will also see his PCP, Infectious Disease, and allergy dr on the same day.  Pt stats he would like to hold off on scheduling this for now because he already has so many other drs involved.  Pt did not know the name of pulmonary dr or the office.  Will forward message back to MW so he is aware of this and to see if he wants to do anything further.  Pls advise.  Thanks!

## 2010-05-17 ENCOUNTER — Other Ambulatory Visit: Payer: Self-pay | Admitting: Pulmonary Disease

## 2010-05-23 NOTE — Op Note (Signed)
NAME:  Ralph Hill, Ralph Hill NO.:  [id number]   MEDICAL RECORD NO.:  [id number]          PATIENT TYPE:  INP   LOCATION:  5509                         FACILITY:  [facility]   PHYSICIAN:  Kinnie Scales. Annalee Genta, M.D.DATE OF BIRTH:  May 04, 1960   DATE OF PROCEDURE:  12/09/2007  DATE OF DISCHARGE:                               OPERATIVE REPORT   PREOPERATIVE DIAGNOSES:  1. Chronic sinusitis.  2. Chronic nasal polyposis.  3. Chronic pulmonary dysfunction.  4. Status post bilateral endoscopic sinus surgery, nasal septoplasty,      and turbinate reduction performed in July 2008.   POSTOPERATIVE DIAGNOSES:  1. Chronic sinusitis.  2. Chronic nasal polyposis.  3. Chronic pulmonary dysfunction.  4. Status post bilateral endoscopic sinus surgery, nasal septoplasty,      and turbinate reduction performed in July 2008.   INDICATION FOR SURGERY:  1. Chronic sinusitis.  2. Chronic nasal polyposis.  3. Chronic pulmonary dysfunction.  4. Status post bilateral endoscopic sinus surgery, nasal septoplasty,      and turbinate reduction performed in July 2008.   SURGICAL PROCEDURES:  Bilateral endoscopic sinus surgery with  intraoperative computer-assisted navigation (Stealth consisting of  bilateral total ethmoidectomy and bilateral nasal frontal recess  exploration.   ANESTHESIA:  General endotracheal.   COMPLICATIONS:  None.   ESTIMATED BLOOD LOSS:  Less than 200 mL.   DISPOSITION:  The patient was transferred from the operating room to the  recovery room in stable condition.   BRIEF HISTORY:  Ralph Hill is a 50 year old white male who has been  followed over the last several years with progressive symptoms of  sinusitis and pulmonary dysfunction.  The patient underwent bilateral  endoscopic sinus surgery, septoplasty, and turbinate reduction in July  2008 for chronic sinus polyps and chronic sinusitis.  The patient had  significant exacerbation of underlying pulmonary dysfunction  when his  sinonasal disease was worsened.  After the surgery, the patient was  diagnosed with colon cancer and underwent partial colectomy and  radiation chemotherapy.  He was lost to followup for a period of time  and when he was reevaluated approximately 6 months after the surgery had  already began to develop recurrent polyp disease.  The patient's  pulmonary dysfunction, chronic sinusitis, and particularly severe cough  continued on an ongoing basis over the next year.  He was treated with  steroid and antibiotic therapy followed closely and treated by Dr. Marcelyn Bruins from [facility] pulmonary division, but unfortunately, we were  unable to adequately control the patient's symptoms.  Dr. Shelle Iron felt  the pulmonary dysfunction was primarily related to chronic sinus  drainage and cough without significant reactive airway disease.  The  patient was admitted to [facility] on December 09, 2007 for  medical management of his chronic cough, a Stealth formatted CT scan for  intraoperative computer-assisted navigation was obtained and the patient  was stabilized from a medical standpoint and scheduled for semiurgent  surgical procedure.  The risks, benefits, and complications of the  procedure were discussed in detail with the patient, his wife and they  understood  and concurred with our plan for surgery which is scheduled  for December 10, 2007.   PROCEDURE:  The patient was brought to the operating room at [facility] OR and placed in supine position on the operating table.  General endotracheal anesthesia was established without difficulty.  When the patient adequately anesthetized, his nose was examined and  injected with a total of 6 mL of 1% lidocaine 1:100,000 solution  epinephrine was injected in submucosal fashion along the lateral nasal  wall, middle turbinate, middle meatus, and within the nasal polyps.  The  patient's nose was then packed with Afrin-soaked  cottonoid pledget which  was left in place for approximately 10 minutes.  The patient was then  positioned on the operating table.  The Stealth navigation headgear was  applied and anatomic and surgical landmarks were identified and  confirmed.  The patient was positioned, prepped and draped in sterile  fashion and surgical procedure was begun.   Beginning on the patient's right-hand side with 0 degree telescope,  nasal endoscopy was performed.  The patient had heavy polypoid disease  filling the common ethmoid cavity and nasal frontal recess.  The  patient's nose was filled with thick mucopurulent crusted secretions.  The maxillary sinus ostium appeared patent and there was no significant  disease within the maxillary sinus.  The patient's nasal cavity was  thoroughly cleared of debris and using the 0-degree telescope,  microdebrider was used to remove polypoid disease and the ethmoid cavity  dissection was carried from anterior to posterior to the ethmoids.  Posterosuperior ethmoid cavity was identified and a 45-degree telescope  was used with the Stealth navigation tool along the roof of the ethmoid  sinus dissecting away polypoid disease, bony septations, and chronic-  appearing sinus infection.  Thick mucoid material was sampled and sent  to Pathology for evaluation of allergic fungal mucin.  The nasal frontal  recess was identified, it was completely occluded with polypoid disease  which was resected with a curved microdebrider, widely patent nasal  frontal recess on the right.   Attention was then turned to the left-hand side again using the 0-degree  telescope for initial evaluation, significant polypoid disease was  resected from middle meatus as well as thick mucopurulent material.  The  left maxillary sinus ostium was patent.  There was no active disease  within the left maxillary sinus.  Ethmoidectomy was then performed using  the 0-degree telescope and the Stealth  navigation tool.  Dissection was  carried from the anterior to posterior along the floor of the ethmoid  sinus removing polypoid disease.  The superoposterior aspect of the  ethmoid sinus was then identified and dissection was carried from  posterior to anterior with a 45-degree telescope and curved  microdebrider to resect polypoid disease and bony septations.  Nasal  frontal recess was explored.  There was polypoid disease and obstruction  which was cleared with a curved microdebrider and three cutting forceps.  The patient's middle turbinate had a tendency to lateralize and the  anterior aspect of the middle turbinate was resected in order to  maintain middle meatus patency.  Nasal frontal recess was widely patent  at the conclusion of the procedure and the ethmoid sinus was cleared of  significant disease.   With the sinus component of procedure finished, the patient's nasal  cavity was thoroughly inspected.  Surgical debris was resected  bilaterally.  There was minimal bleeding.  A 50:50 mix of Bactroban and  Kenalog 40 was then instilled as a slurry into the frontal ethmoid  maxillary sinuses bilaterally and bilateral Kennedy sinus packs were  placed in common ethmoid cavity on each side.  The patient's nasal  cavity was irrigated and suctioned, oral cavity was  suctioned.  Orogastric tube was passed and the contents were aspirated.  The patient was then awakened from his anesthetic, he was extubated and  was transferred from the operating room to the recovery room in stable  condition.  There were no complications and blood loss was less than 200  mL.           ______________________________  Kinnie Scales. Annalee Genta, M.D.     DLS/MEDQ  D:  04/54/0981  T:  12/11/2007  Job:  [id number]

## 2010-05-23 NOTE — H&P (Signed)
NAME:  Ralph Hill, Ralph Hill NO.:  [id number]   MEDICAL RECORD NO.:  [id number]          PATIENT TYPE:  EMS   LOCATION:  MAJO                         FACILITY:  [facility]   PHYSICIAN:  Barbaraann Share, MD,FCCPDATE OF BIRTH:  02-06-1960   DATE OF ADMISSION:  01/08/2007  DATE OF DISCHARGE:                              HISTORY & PHYSICAL   HISTORY OF PRESENT ILLNESS:  The patient is a very pleasant 49 year old  gentleman who I usually follow in the office for asthma as well as  chronic sinusitis and severe cough that I suspect is related to upper  airway dysfunction.  The patient has been having great difficulty with  cough control, and has gotten to the point where the cough paroxysms  will lead to vomiting as well as syncope and breathlessness.  We have  tried aggressive suppression with Tussionex as an outpatient as well as  p.o. Augmentin for presumed sinusitis.  He comes to the ER today after a  severe bout of cough and also after he had noticed a small amount of  hemoptysis.  In the emergency room his chest x-ray was totally clear but  he had very prominent upper airway pseudo wheezing.  His lungs were  totally clear to auscultation except for the transmitted upper airway  noises.  The patient will need to be admitted at this time for more  aggressive treatment of his refractory cough and possible sinusitis.   PAST MEDICAL HISTORY:  Is significant for:  1. Asthma.  2. Chronic sinusitis.  3. History of severe cough related to upper airway dysfunction.  4. History of recent metastatic colon cancer for which he is receiving      chemotherapy via a Port-A-Cath.   CURRENT MEDICATIONS:  Include Prilosec 20 mg b.i.d., Mucinex 1 tablet  b.i.d., Tussionex and Tessalon Perles for cough, Symbicort 160/4.5 two  puffs b.i.d., Ativan 0.5 mg q.8 h, also albuterol and DuoNeb p.r.n.  rescue only.   ALLERGIES:  The patient is intolerant to ADVAIR.   SOCIAL HISTORY:  The patient  works in [employer] and does not  smoke.  He lives with his wife.   FAMILY HISTORY:  Is noncontributory.   REVIEW OF SYSTEMS:  As per history of present illness.   PHYSICAL EXAM:  GENERAL:  He is a thin male in no acute distress but  clearly appears very fatigued.  VITAL SIGNS:  Blood pressure 133/90, pulse is 100, respiratory rate 12,  temperature is 97, O2 saturation on room air is 97%.  HEENT:  Pupils equal, round and reactive to light and accommodation.  Extraocular muscles are intact.  Nares shows crusting of blood and  possibly purulence.  Oropharynx is clear.  NECK:  Neck is supple without JVD or lymphadenopathy.  There is no  palpable thyromegaly.  CHEST:  Chest is totally clear to auscultation but there is very loud  upper airway pseudo wheezing with transmission to the lung fields.  CARDIAC:  Reveals a regular rate and rhythm.  No murmurs, rubs or  gallops.  ABDOMEN:  Soft, nontender with good bowel sounds.  GENITALIA, RECTAL, BREAST:  Exam was not done and not indicated.  LOWER EXTREMITIES:  Without edema.  Pulses are intact distally.  NEUROLOGICAL:  Alert and oriented with no obvious motor deficits.   IMPRESSION:  1. Severe refractory cough secondary to a cyclical mechanism, as well      as possible acute sinusitis.  He now has the symptoms of classic      vocal cord dysfunction to the point of breathlessness and severe      anxiety.  The patient has failed outpatient treatment with      aggressive narcotic cough suppression as well as p.o. antibiotics.      He will need admission at this time for more aggressive treatment.  2. Hemoptysis that I suspect is coming from his upper airway given the      condition of his nares.  His chest x-ray was totally clear.  If he      continues to have difficulty he may need an upper airway      exam/bronchoscopy.   PLAN:  1. Will admit for aggressive cough suppression with Mepergan Forte.  2. IV antibiotics with Zosyn.  3.  CT scan of the sinuses.      Barbaraann Share, MD,FCCP  Electronically Signed     [facility]/MEDQ  D:  01/08/2007  T:  01/08/2007  Job:  [id number]

## 2010-05-23 NOTE — Assessment & Plan Note (Signed)
[facility]                             PULMONARY OFFICE NOTE   Ralph Hill, Ralph Hill                         MRN:          [id number]  DATE:11/13/2006                            DOB:          Mar 18, 1960    HISTORY OF PRESENT ILLNESS:  The patient is a 50 year old white male, a  patient of Dr. Shelle Iron, who has a known history of asthmatic bronchitis,  bronchiectasis, and chronic sinus disease presents today for an acute  office visit.  The patient reports that over the last week he has had  increased productive cough, congestion, hoarseness, and wheezing with  shortness of breath.  The patient was seen in the emergency room on  November 08, 2006, and started on a 7-day course of Avelox and prednisone  taper.  The patient reports the symptoms have only minimally improved  and he continues to have severe coughing paroxysms, frequent throat  clearing, and shortness of breath for which he has used an albuterol  several times a day.  The patient has had recurrent episodes of  asthmatic bronchitic flares over the last year requiring multiple  courses of antibiotics and steroids, despite aggressive therapy with  Symbicort, Singulair, Xolair.  The patient has also been treated for  aggressive reflux with Zegerid twice daily and without any significant  improvement.  The patient has undergone sinus surgery with persistent  symptoms and continues to have persistent nasal congestion, post nasal  drip, and hoarseness.  The patient also has been diagnosed with colon  cancer and is concerned because he is undergoing surgery on November  14th.  The patient denies any hemoptysis, orthopnea, PND, or leg  swelling.   PAST MEDICAL HISTORY:  Reviewed.   CURRENT MEDICATIONS:  Reviewed.   PHYSICAL EXAMINATION:  GENERAL:  The patient is a pleasant male in no  acute distress.  VITAL SIGNS:  He is afebrile with stable vital signs.  O2 saturation is  94% on room air.  HEENT:   Nasal mucosa is slightly pale, nontender sinuses.  Posterior  pharynx is clear.  NECK:  Supple without cervical adenopathy.  No JVD.  RESPIRATORY:  Lung sounds reveal coarse breath sounds without any  wheezing or crackles.  CARDIAC:  Regular rate.  ABDOMEN:  Soft and nontender.  EXTREMITIES:  Warm without any edema.   IMPRESSION:  Recurrent asthmatic bronchitic exacerbation with suspected  underlying reflux and rhinitis symptoms.   PLAN:  1. The patient is to finish Avelox and prednisone as recommended.  2. Continue on aggressive cough suppression regimen with Mucinex DM      twice daily and tramadol every 4 hours for breakthrough coughing.  3. The patient was given a sheet for cough suppression with non-mint      lozenges, sugarless candy, and ice chips to avoid clearing his      throat and coughing.  4. We will add in Chlor-Trimeton 4 mg tablets every 4 hours as needed      for any post nasal drip symptoms that could be irritating the      airways as well.  5. And, we will continue on aggressive reflux prevention with Zegerid      twice daily along with reflux preventative measures.  6. The patient will return back with Dr. Shelle Iron as scheduled.  The      patient is advised to call the office for sooner followup if      symptoms do not improve or worsen.      Rubye Oaks, NP  Electronically Signed      Barbaraann Share, MD,FCCP  Electronically Signed   TP/MedQ  DD: 11/13/2006  DT: 11/13/2006  Job #: [id number]

## 2010-05-23 NOTE — Assessment & Plan Note (Signed)
[facility]                                 ON-CALL NOTE   TYREQUE, FINKEN                         MRN:          [id number]  DATE:11/08/2006                            DOB:          08/13/1960    I was contacted by Bing Quarry, stating that she was the wife of Mr.  Marce Charlesworth, approximately 7:30 Friday evening, November 08, 2006.  She  stated that she had called the office for medications for her husband  for severe coughing paroxysms that were taking his breath away.  He was  called in Tramadol by Barbaraann Share, MD,FCCP, but he is feeling  dizzy from the Tramadol and it is not controlling the cough.  She was  asking if something different could be called in for the cough and/or  whether she should try his nebulizer for the cough.  I stated that if he  was short of breath during coughing paroxysms I think it would be fine  to try the nebulizer, but that if Dr. Shelle Iron could not treat this  problem over the phone knowing the patient as well as he does, it is  unlikely that I could either and it would be better that he be seen  sooner rather than later going into the weekend.  Therefore, if he is  not improved after his nebulizer, he is to go to the emergency room for  evaluation and treatment.  I instructed her to take him to the emergency  room for evaluation and treatment.     Charlaine Dalton. Sherene Sires, MD, [facility]  Electronically Signed    MBW/MedQ  DD: 11/08/2006  DT: 11/10/2006  Job #: [id number]

## 2010-05-23 NOTE — Op Note (Signed)
NAME:  Ralph Hill, Ralph Hill                ACCOUNT NO.:  [id number]   MEDICAL RECORD NO.:  [id number]          PATIENT TYPE:  AMB   LOCATION:  SDS                          FACILITY:  [facility]   PHYSICIAN:  Ralph Hill, M.D.DATE OF BIRTH:  06/10/1960   DATE OF PROCEDURE:  07/11/2006  DATE OF DISCHARGE:                               OPERATIVE REPORT   PREOPERATIVE DIAGNOSIS:  1. Chronic sinusitis.  2. Nasal septal deviation with airway obstruction.  3. Inferior turbinate hypertrophy.  4. Reactive airway disease.   POSTOPERATIVE DIAGNOSIS:  1. Chronic sinusitis.  2. Nasal septal deviation with airway obstruction.  3. Inferior turbinate hypertrophy.  4. Reactive airway disease.   SURGICAL PROCEDURES:  1. Bilateral endoscopic sinus surgery with computer assisted      navigation (Stealth) consisting of bilateral total ethmoidectomy,      bilateral maxillary antrostomy with removal of diseased tissue, and      bilateral nasal frontal recess exploration.  2. Nasal septoplasty.  3. Bilateral inferior turbinate reduction.   ANESTHESIA:  General endotracheal.   SURGEON:  Ralph Hill, M.D.   COMPLICATIONS:  None.   BLOOD LOSS:  Approximately 200 mL.   DISPOSITION:  The patient was transferred from the operating room to the  recovery room in stable condition.   BRIEF HISTORY:  The patient is a 50 year old white male who is referred  for evaluation of chronic sinusitis.  The patient has a history of  reactive airway disease and has had progressive problems with the  pulmonary dysfunction, evaluated and treated through his pulmonologist,  he was referred to our office because of chronic sinus complaints. CT  scanning showed significant polypoid and mucosal disease with air fluid  levels in the sinuses bilaterally, severely deviated septum, and  turbinate hypertrophy.  The patient was treated with multiple course of  broad spectrum oral antibiotics, oral and topical steroids, and  allergy  therapy, and despite this aggressive treatment, continued to have  problems with his sinuses as well as continued pulmonary dysfunction.  Follow-up CT scan was performed which showed continued chronic mucosal  disease and airway obstruction.  Given the patient's history,  examination and findings, I recommended the above surgical procedures.  The risks and benefits of these procedures were discussed in detail with  the patient and his wife and they understood and concurred with our plan  for surgery which is scheduled as above.  Prior to surgery, a Stealth  formatted CT scan was performed for intraoperative computer navigation.   SURGICAL PROCEDURE:  Ralph Hill was brought to the operating room on  July 11, 2006, placed in the supine position on the operating table.  General endotracheal anesthesia was established without difficulty.  The  patient was adequately anesthetized in this position on the operating  table and prepped and draped in a sterile fashion.  The patient's nose  was then injected with a total of 12 mL of 1% lidocaine with 1:100,000  epinephrine which was injected in a submucosal fashion along the nasal  septum, inferior turbinates, middle turbinates, lateral nasal wall, and  uncinate process.  The patient's nose was then packed with Afrin soaked  cottonoid pledgets, these were left in place for approximately ten  minutes to allow for vasoconstriction and hemostasis.  The Stealth  navigation headgear was then applied and anatomic surgical landmarks  were identified and confirmed and the Stealth device was used throughout  the endoscopic sinus portion of the case.   With the patient prepped and draped in a sterile fashion and positioned  on the operating table, the surgical procedure was begun with right  endoscopic sinus surgery.  I used a 0 degrees telescope and the middle  turbinate was gently medialized.  The uncinate process was reflected  medially and  resected with the through cutting forceps.  There was  polypoid material within the middle meatus which was resected using a  straight microdebrider.  Dissection was then carried from anterior to  posterior through the ethmoid region, removing the ethmoid bulla, and  bony septations and polypoid disease. Posterior ethmoid air cells were  cleared of disease and the superior aspect of the posterior ethmoid  sinus was identified with the Stealth device. Using a 45 degrees  telescope, dissection was then carried out along the roof of the ethmoid  sinus from posterior to anterior, removing bony septations and diseased  mucosa.  The nasal frontal recess was identified.  This was completely  occluded with polypoid material and underlying ethmoid air cells which  were cleared.  The right nasal frontal recess was then debrided to  create a widely patent ostium.  The sinus, itself, had minimal polypoid  mucosa and no discharge.  Attention was then turned to the lateral nasal  wall where the natural ostium was enlarged in an anterior, inferior and  posterior direction. Within the maxillary sinus, there was thick  polypoid material and debris which was cleared and the sinus ostium was  patent at the conclusion of the procedure.   Nasal septoplasty was then performed.  A right anterior hemitransfixion  incision was created. A mucoperichondrial flap was elevated from  anterior to posterior on the patient's right hand side.  Deviated bone  and cartilage were mobilized and the mucoperichondrial flap was elevated  up on the patient's left hand side after crossing the midline. Anterior,  dorsal, and columellar cartilage was not deviated and this was left  intact.  The patient had a large inferior septal spur which was  mobilized with a 4 mm osteotome preserving the overlying mucosa.  This  was resected creating a midline nasal septum.  Mid septal cartilage was  removed, morselized, and returned to the  mucoperichondrial pocket at the  conclusion of the surgical procedure. The mucoperichondrial flaps were  reapproximated with a 4-0 gut suture on a Keith needle in a horizontal  mattress fashion and bilateral Doyle nasal septal splints were placed  after the application of Bactroban ointment and sutured in position with  a 3-0 Ethilon suture.   Attention was turned to the left hand side. The septum was brought to  the midline.  The left nasal cavity was examined using a 0 degrees  telescope.  The middle turbinate was medialized.  The uncinate process  was resected and dissection was carried out along the floor of the  ethmoid from anterior to posterior using a 0 degrees endoscope and a  straight microdebrider.  The  posterior superior ethmoid area was  identified.  There was thick polypoid material here which was cleared.  Bony septations were removed and dissection was then carried  from  posterior to anterior along the roof of the ethmoid sinus using a 45  degrees telescope, curved microdebrider, and Stealth navigation. The  nasal frontal recess was identified.  This was completely occluded with  underlying disease which was resected with a curved microdebrider.  Anterior ethmoid cells were cleared of disease and the nasal frontal  recess was widely opened. This allowed access to the frontal sinus which  was clear of any active infection.  There was minimal polypoid disease.  Attention was turned to the lateral nasal wall where the natural ostia  of the maxillary sinus was identified.  There was thick polypoid mucosa  obstructing the sinus which was cleared with a microdebrider.  The  natural ostium was enlarged in an anterior, inferior and posterior  direction and polypoid disease within the sinus was cleared with a  microdebrider.   Attention was then turned to the inferior turbinates where bilateral  inferior turbinate reduction was performed with bipolar intramural  cautery set at  12 watts. Two submucosal passes were made in each  inferior turbinate.  When the turbinates were adequately cauterized,  they were outfractured to create a more patent nasal cavity. Turbinate  reduction was performed by creating an anterior incision and removing a  small amount of turbinate bone preserving the overlying mucosa.   The patient's nasal cavity and nasopharynx were then irrigated and  inspected. Surgical debris was resected and there was minimal bleeding.  A 50/50 mixture of Kenalog 40 and Bactroban cream was then instilled  within the frontal, ethmoid, and maxillary sinuses bilaterally and  Kennedy sinus packs were placed in the common ethmoid cavity.  The  patient's oral cavity and oropharynx were irrigated and suctioned.  An  orogastric tube was passed and the stomach contents were aspirated.  The  patient was then awakened from his anesthetic, extubated, and  transferred from the operating room to the recovery room in stable  condition.  No complications.  Blood loss approximately 200 mL.           ______________________________  Ralph Hill, M.D.     DLS/MEDQ  D:  40/98/1191  T:  07/11/2006  Job:  [id number]

## 2010-05-23 NOTE — Discharge Summary (Signed)
NAME:  Ralph Hill, Ralph Hill NO.:  [id number]   MEDICAL RECORD NO.:  [id number]          PATIENT TYPE:  INP   LOCATION:  1306                         FACILITY:  [facility]   PHYSICIAN:  Barbaraann Share, MD,FCCPDATE OF BIRTH:  05[birth year]59   DATE OF ADMISSION:  10/27/2007  DATE OF DISCHARGE:  10/31/2007                               DISCHARGE SUMMARY   DIAGNOSES AT THE TIME OF DISCHARGE:  1. Severe refractory cough with syncope.  2. Acute on chronic sinusitis.   PROCEDURES:  None.   HISTORY OF PRESENT ILLNESS:  Please see H and P at time of admission.   HOSPITAL COURSE:  The patient was admitted with a severe refractory  cough and syncope secondary to a cyclical cough mechanism, as well as  acute on chronic sinusitis.  He was treated aggressively with IV  antibiotics, as well as steroids, and placed on a very aggressive cough  suppression regimen.  He was also treated with benzodiazepines at fairly  high dose to help with the cyclical mechanism.  Initially, his cough was  very difficult to control despite all of the medications but then he  began to improve.  By the day of discharge, his cough was well  controlled for approximately 36 hours and he felt the cyclical mechanism  was settling down..  The patient had no further purulence noted while on  the antibiotics and he will be sent home on another 1-week course.  He  had no acute exacerbation of his asthma while in the hospital.  It  remained stable throughout.  He is being discharged on an aggressive  cough suppression regimen with followup with me in 2 weeks.   DISCHARGE MEDICATIONS:  Include:  1. Symbicort 160/4.5 two puffs b.i.d. with mouth rinsing.  2. ProAir 2 puffs every 4 to 6 hours p.r.n.  3. DuoNeb nebulizers every 6 hours p.r.n. rescue.  4. Mepergan Forte 50/25 one every 4 to 6 hours as needed for cough.  5. Ativan 1 mg every 8 hours p.r.n.  6. Avelox 400 mg 1 p.o. daily for 7 days.  7. Prilosec 20 mg b.i.d.  as he usually takes at home.  8. Prednisone taper starting at 40 mg a day and tapering off over      approximately 12 days.  9. TussiCaps 10/8 mg 1 at bedtime as needed for cough.   DIET:  No restrictions.   ACTIVITIES:  No restrictions.   FOLLOWUP:  With me in 2 weeks.  The patient was to call and get an  appointment.      Barbaraann Share, MD,FCCP  Electronically Signed     KMC/MEDQ  D:  10/31/2007  T:  10/31/2007  Job:  [id number]

## 2010-05-23 NOTE — Assessment & Plan Note (Signed)
[facility]                             PULMONARY OFFICE NOTE   EL, PILE                         MRN:          [id number]  DATE:08/13/2006                            DOB:          [birth year]    HISTORY OF PRESENT ILLNESS:  Patient is a 50 year old white male patient  of Dr. Shelle Iron who has a known history of sinusitis.  Patient has  recently underwent sinus surgery on July 11, 2006 by Dr. Annalee Genta.  Patient presents today for an acute office visit.  Complains over the  last 5 days that he has had increased nasal congestion, purulent nasal  discharge, shortness of breath, chest tightness, and wheezing.  Patient  was recently seen by his ENT, I believe, and started on Augmentin, which  he is on day 4.  Patient reports that symptoms are very slowly  improving, however, he continues to have some intermittent cough and  wheezing.  Patient denies any hemoptysis, orthopnea, PND, or leg  swelling.   PAST MEDICAL HISTORY:  Reviewed.   CURRENT MEDICATIONS:  Reviewed.   PHYSICAL EXAMINATION:  Patient is a pleasant male in no acute distress.  He is afebrile with stable vital signs.  His O2 saturation is 97% on  room air.  HEENT:  Nasal mucosa is erythematous.  Maxillary sinus tenderness.  TMs  are normal.  Posterior pharynx is clear.  NECK:  Supple without cervical adenopathy.  No JVD.  LUNG SOUNDS:  Reveal coarse breath sounds bilaterally with a few  expiratory wheezes.  CARDIAC:  Regular rate.  ABDOMEN:  Soft and nontender.  EXTREMITIES:  Warm without any edema.   IMPRESSION AND PLAN:  Acute rhinitis versus sinusitis with mild  asthmatic flare.  Patient is recommended to finish Augmentin as  recommended.  He does have a followup appointment with Dr. Annalee Genta  this afternoon as recommended.  Continue to follow up.  He is to add in  Mucinex DM twice daily, and continue on his saline rinses as  recommended.  He will begin a short prednisone taper  over the next week.  Patient will return back with Dr. Shelle Iron in 2 weeks, or sooner if  needed.     Rubye Oaks, NP  Electronically Signed      Barbaraann Share, MD,FCCP  Electronically Signed   TP/MedQ  DD: 08/14/2006  DT: 08/14/2006  Job #: [id number]

## 2010-05-23 NOTE — Consult Note (Signed)
NAME:  Ralph Hill, Ralph Hill NO.:  [id number]   MEDICAL RECORD NO.:  [id number]          PATIENT TYPE:  INP   LOCATION:  5126                         FACILITY:  [facility]   PHYSICIAN:  Zola Button T. Lazarus Salines, M.D. DATE OF BIRTH:  March 13, 1960   DATE OF CONSULTATION:  01/10/2007  DATE OF DISCHARGE:                                 CONSULTATION   CHIEF COMPLAINT:  Coughing paroxysms.   HISTORY:  This is a 50 year old white male well-known to Dr. 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  The patient was last seen in early November with basically  bilateral, almost complete, sinus opacification felt secondary to  residual nasal polyps.   Six weeks ago, the patient was discovered to have colon cancer and  underwent resection of the colon, multiple lymph nodes, as well as part  of the liver.  He claims he has been doing some coughing ever since  then.  Here recently, it has been essentially worse.  He has gotten to  the point where the paroxysms are so violent that he almost faints.  He  is bringing up some blood with the violent coughing.  He came into the  hospital two days ago for further attention to the coughing.  CT scan  once again shows near-complete opacification of the paranasal sinuses.  Besides the blood, he has had nasal drainage, especially postnasal.  Dr.  Shelle Iron is convinced that he does not have bronchospasm, as pulmonary  function testing did not show any distal airway disease.  ENT was called  for additional advice regarding the sinusitis and cough.  He did receive  a dose of intravenous Solu-Medrol, 40 mg earlier today and is definitely  better.  He has received intravenous Zosyn since his admission, now two  days.  No pain in his face.  No change in his voice.  No difficulty  swallowing.   PHYSICAL EXAMINATION:  This is a thin, middle-aged white male in no  obvious distress.  Mental status is appropriate.  He hears well in  conversational speech.  Voice is clear, and respirations unlabored  through nose and mouth.  With vigorous inspiration and expiration, he  does have mild wheezing audible from his mouth.  The head is atraumatic  and neck supple.  Cranial nerves intact.  Ear canals are clear with  normal aerated drums.  Anterior nose shows some small polyps up of the  middle meatus and vault of the nose on both sides.  The airway is really  quite patent.  No obvious drainage.  There is a small amount of bloody  crusting in the left side of the nose.  Oral cavity is clear with teeth  in good repair and moist membranes.  Oropharynx clear.  I could not  directly see nasopharynx or hypopharynx.  Neck without adenopathy.   Following instillation of 5 mL of 1% viscous Xylocaine in both sides of  the  nose, the flexible laryngoscope was introduced.  The nasopharynx is  clear with normal eustachian tori and a small amount of residual  adenoids.  The inside of the nose was inspected with the scope, showing  some polypoid changes but no frank pus.  Oropharynx is clear.  Hypopharynx shows mobile vocal cords without contact lesions or  erythema/swelling.  No pooling in valleculae or piriformis.  The vocal  cords have full mobility and abduct with breathing and adduct with  phonation.  With vigorous forced inspiration and expiration, he has a  mild wheezing noise, but the vocal cords remained fully abducted without  any motion to contribute to the wheezing.   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  Probable acute-on-chronic pansinusitis with humeral mediated  bronchospasm.  No evidence of vocal cord dysfunction despite audible  wheezing.  Rapid improvement on a single dose of IV Solu-Medrol and 48  hours of intravenous antibiotics.  I  wonder if there is any  consideration for diaphragmatic irritation causing cough after a liver  resection.   PLAN:  I agree with intravenous steroids to transition to oral steroids  with a prolonged taper over 30 days or more.  I agree with intravenous  antibiotics with transfer to oral antibiotics including anaerobic  coverage such as Augmentin or quinolone plus clindamycin.  I would have  him increase nasal hygiene measures, continue the nasal steroid sprays,  continue antireflux measures, and avoid antihistamines and other drying  agents.  I would have him come back to see Dr. Annalee Genta in two weeks to  assess his progress.  It is possible that he will require further  removal of the polyps from his sinuses, but even if this were the  decision, it is optimal to reduce inflammation, swelling, and infection  maximally with the combination of prolonged steroids and antibiotics  before considering any surgical intervention.  I discussed this with the  patient and his wife, and we discussed this at quite some length.  They  understand and agree with the discussion and plans.      Gloris Manchester. Lazarus Salines, M.D.  Electronically Signed     KTW/MEDQ  D:  01/10/2007  T:  01/10/2007  Job:  [id number]   cc:   Barbaraann Share, MD,FCCP

## 2010-05-23 NOTE — Discharge Summary (Signed)
NAME:  R[facility], BEAN NO.:  [id number]   MEDICAL RECORD NO.:  [id number]          PATIENT TYPE:  INP   LOCATION:  5126                         FACILITY:  [facility]   PHYSICIAN:  Barbaraann Share, MD,FCCPDATE OF BIRTH:  27-Apr-1959   DATE OF ADMISSION:  01/08/2007  DATE OF DISCHARGE:  01/15/2007                               DISCHARGE SUMMARY   DISCHARGE DIAGNOSES:  1. Refractory cough.  2. Acute on chronic sinusitis with nasal polyposis.  3. Bronchial asthma.   PROCEDURES:  Were none.   HISTORY OF PRESENT ILLNESS:  Please see dictated H and P at time of  admission.   HOSPITAL COURSE:  The patient was admitted with refractory cough that  was secondary to a cyclical mechanism as well as chronic sinus disease.  The patient was placed on aggressive cough suppression with Mepergan  Forte and also increased doses of Ativan in order to control anxiety as  well as cough.  The patient underwent CT scan of the sinuses which  showed opacification of the frontal, ethmoid and sphenoid sinuses due to  pulposus and infection.  The patient was placed on antibiotics as well  as an aggressive nasal hygiene regimen and also IV steroids.  The  patient's asthma was totally controlled throughout the whole  hospitalization and really was never an issue.  Ultimately the patient's  cough began to improve with very aggressive suppression as well as  treatment of his sinus disease with steroids and antibiotics.  By the  day of discharge he was cough free and felt much improved.  I stressed  to him again the cyclical nature of his cough and that it was very  important that he not allow it to escalate.   CONDITION ON DISCHARGE:  Excellent.   DISCHARGE INSTRUCTIONS:  Diet and activity restrictions, none.  Follow-  up will be with me and 1 week and with Dr. Annalee Genta in 2 weeks.   DISCHARGE MEDICATIONS:  Medications on discharge include:  1. Prednisone 40 mg daily for 2 weeks and then 20 mg  a day      indefinitely.  2. Ativan 1 mg t.i.d. on a scheduled basis.  3. Augmentin 875 1 b.i.d. times 14 days.  4. Mepergan Forte 1 tablet up to t.i.d. p.r.n. cough.  5. The patient will maintain on his reflux medications of b.i.d.  6. Protonix as well as his Symbicort inhaler.  7. He also has albuterol for rescue.      Barbaraann Share, MD,FCCP  Electronically Signed     K[facility]/MEDQ  D:  01/15/2007  T:  01/15/2007  Job:  [id number]   cc:   Barbaraann Share, MD,FCCP  Kinnie Scales Annalee Genta, M.D.

## 2010-05-23 NOTE — Assessment & Plan Note (Signed)
[facility]                                 ON-CALL NOTE   ERLE, Ralph Hill                         MRN:          [id number]  DATE:06/05/2006                            DOB:          1960/03/05    TELEPHONE NUMBER:  [phone]   I was contacted by Bing Quarry at 7:20pm on 5/28 indicating that Dr.  Shelle Iron has been treating the patient for sinusitis and possible asthma  (she tells me that Dr. Shelle Iron is not actually sure that he has an asthma  at this point), but that he seems somewhat better after taking a round  of antibiotics (Augmentin), and now gradually worsening over the last  several days with increasing chest tightness with no purulent sputum, or  pleuritic pain and partial relief from the nebulizer, which has been the  pattern apparently since last August.   Based on the above history, I was not able to treat the patient over the  phone, but did advise the patient to continue the nebulizer every 4  hours as needed and if there is not adequate relief after nebulizer to  go to the emergency room. If he can obtain adequate relief during the  night with the nebulizer then he should come to the office in the  morning for reassessment either by our nurse practitioner or Dr. Shelle Iron.     Charlaine Dalton. Sherene Sires, MD, [facility]  Electronically Signed    MBW/MedQ  DD: 06/05/2006  DT: 06/06/2006  Job #: [id number]   cc:   Barbaraann Share, MD,FCCP

## 2010-05-23 NOTE — Assessment & Plan Note (Signed)
[facility]                                 ON-CALL NOTE   NAME:Hill, Ralph                         MRN:          [id number]  DATE:11/08/2006                            DOB:          12/18/1960    I was contacted by [name] Coach, stating that she was the wife of Mr.  Sheri Tabak, approximately 7:30 Friday evening, November 08, 2006.  She  stated that she had called the office for medications for her husband  for severe coughing paroxysms that were taking his breath away.  He was  called in Tramadol by Keith M. Clance, MD,FCCP, but he is feeling  dizzy from the Tramadol and it is not controlling the cough.  She was  asking if something different could be called in for the cough and/or  whether she should try his nebulizer for the cough.  I stated that if he  was short of breath during coughing paroxysms I think it would be fine  to try the nebulizer, but that if Dr. Clance could not treat this  problem over the phone knowing the patient as well as he does, it is  unlikely that I could either and it would be better that he be seen  sooner rather than later going into the weekend.  Therefore, if he is  not improved after his nebulizer, he is to go to the emergency room for  evaluation and treatment.  I instructed her to take him to the emergency  room for evaluation and treatment.     Michael B. Wert, MD, FCCP  Electronically Signed    MBW/MedQ  DD: 11/08/2006  DT: 11/10/2006  Job #: [id number] 

## 2010-05-23 NOTE — Assessment & Plan Note (Signed)
[facility]                                 ON-CALL NOTE   KEVAL, NAM                         MRN:          [id number]  DATE:09/04/2006                            DOB:          1960/08/20    SUBJECTIVE:  I was contacted at 4:45 this morning by Mr. Potenza's wife.  The patient awoke in the middle of this night with cough and began  coughing excessively. Per his wife, he has a history of chronic  sinusitis and asthma. His cough is nonproductive. He has no fever or  chest pain. When he is not coughing, there is no excessive shortness of  breath. He does have nebulized bronchodilators at home and took a dose  of this without relief. In the few minutes between the phone call to me  and my returning of the phone call, the cough seems to be abating. His  wife is not sure whether he has any cough suppressants at home, but he  does have throat lozenges.   PLAN:  I suggested that he use the cough suppressant if there is any  available in the household. Otherwise, the throat lozenge seems like a  reasonable idea. I suggested that they contact our office first thing  this morning after the office opens to arrange proper followup with Dr.  Shelle Iron or one of our nurse practitioners.     Oley Balm Sung Amabile, MD  Electronically Signed    DBS/MedQ  DD: 09/04/2006  DT: 09/04/2006  Job #: [id number]   cc:   Barbaraann Share, MD,FCCP

## 2010-05-23 NOTE — Discharge Summary (Signed)
NAME:  [facility] Hill, Ralph Hill NO.:  [id number]   MEDICAL RECORD NO.:  [id number]          PATIENT TYPE:  INP   LOCATION:  5509                         FACILITY:  [facility]   PHYSICIAN:  Barbaraann Share, MD,FCCPDATE OF BIRTH:  [birth year]07-1960   DATE OF ADMISSION:  12/09/2007  DATE OF DISCHARGE:  12/12/2007                               DISCHARGE SUMMARY   DISCHARGE DIAGNOSES:  1. Refractory cough with cough syncope.  2. Acute on chronic sinusitis.   PROCEDURES:  Sinus surgery with Stealth guidance per Dr. Annalee Genta.   HISTORY OF PRESENT ILLNESS:  Please see dictated H&P at the time of  admission.   HOSPITAL COURSE:  The patient was admitted with refractory cough and  syncope secondary to chronic sinusitis, a cyclical mechanism, and a  component of asthma.  He was placed on IV antibiotics as well as IV  steroids along with aggressive cough suppression.  He was seen in  consultation by Otolaryngology who ultimately recommended surgery.  The  patient underwent sinus surgery without complication and was greatly  improved.  His cough during the hospitalization has been well  controlled, and his pulmonary status has been excellent.  He has had no  significant wheezing, limitation in air flow, and excellent O2  saturations.  By the day of discharge, his lungs were totally clear to  auscultation and the patient felt his cough was well controlled.  He had  no complication from his surgery.  He is being discharged home today  with followup per Otolaryngology and also with me in the office in 2  weeks.  He will be sent home on antibiotics by mouth, but his sinus  culture will be checked and changes made if necessary.  He will also be  on tapering prednisone.   MEDICATIONS:  Discharge medications include his usual pulmonary  medications at home.  New medications include Percocet 5/375 1-2 q.4-6  h. p.r.n. per Otolaryngology, also Augmentin 875 p.o. b.i.d. for the  next 10 days.  Again,  the patient will be contacted if his culture  indicates a different antibiotic is required.  Prednisone 30 mg p.o.  daily for 3 days followed by 20 mg p.o. daily for 3 days followed by 10  mg p.o. daily for 3 days and then discontinue.   DISCHARGE INSTRUCTIONS:  The patient has no restrictions.  He is to  follow up with Dr. Annalee Genta in 1 week and with me in 2 weeks.  Numbers  have been left on the chart to make these appointments.      Barbaraann Share, MD,FCCP  Electronically Signed     KMC/MEDQ  D:  12/12/2007  T:  12/12/2007  Job:  [id number]   cc:   Onalee Hua L. Annalee Genta, M.D.

## 2010-05-26 NOTE — Letter (Signed)
June 08, 2009     RE:  FOWLER, ANTOS  MRN:  [id number]  /  DOB:  1960/04/26   To Whom It May Concern:   I am writing on behalf of Mr. Ralph Hill, a patient followed for  various pulmonary issues.  I have been asked by the patient to write a  letter verifying the reasons for a recent hospitalization.  I have been  asked to do this by [facility] for their records.  They are  requesting this information in letter format on our practice's  letterhead.   DIAGNOSES THE TIME OF DISCHARGE:  1. Severe refractory cough with syncope secondary to acute on chronic      sinusitis and severe upper airway inflammation.  Please see      hospital discharge summary that is in your possession for details.  2. Chronic asthma.   If I can be of further service or answer any other questions, please  feel free to call.    Sincerely,      Barbaraann Share, MD,FCCP  Electronically Signed    KMC/MedQ  DD: 06/08/2009  DT: 06/09/2009  Job #: [id number]

## 2010-05-26 NOTE — Assessment & Plan Note (Signed)
[facility]                             PULMONARY OFFICE [facility]   [facility] Hill, Ralph Hill                         MRN:          [id number]  DATE:05/07/2006                            DOB:          1960-04-06    HISTORY OF PRESENT ILLNESS:  The patient is a very pleasant 50 year old  gentleman who I have been asked to see for chronic cough and pulmonary  symptoms. The patient states that he has been in his usual state of  health until August of this year when he began coughing for no reasons.  This was very severe with coughing paroxysms. He was treated with  multiple rounds of antibiotics and cough medicines but continued to be  an issue. In December, he was diagnosed with possible pneumonia and was  treated with antibiotics at that time. I do have a chest x-ray from  December that does appear to have a left lower lobe infiltrate overlying  an area of bronchiectasis. The patient had been seen by  [facility] in  January and told that he possibly had asthma and possibly GERD driving  his cough. He was placed on Qvar and albuterol as well as Prilosec.  Cough got worse according to the patient. He has also been tried on  Advair most recently and had swelling of his lips and a rash, and there  was a concern about a drug reaction. The patient has had multiple  prednisone tapers and feels that it has helped in the past.  Approximately two days ago, he had to call EMS for increasing shortness  of breath and cough paroxysms. He was treated with a nebulizer treatment  and placed on a prednisone taper. He was also given a nebulizer at home  to use p.r.n. The patient feels that his cough has improved since being  on the prednisone. Currently, his cough is more controlled and has scant  white mucus. He does not feel congested in his chest. He has some  dyspnea on exertion, primarily with heavier exertion. The patient notes  a lot of allergies and has apparently had a  workup either done or set up  by [facility] last week. He denies any sinus pressure or purulence. He  may be having post nasal drip. He denies any GERD symptoms currently.   PAST MEDICAL HISTORY:  1. Significant for chronic allergic symptoms.  2. History of what sounds like a decortication in 1990 after an      episode of a pneumonia and what sounds like empyema. This was done      at [facility].  3. History of knee surgery.  4. History of hernia repair.   CURRENT MEDICATIONS:  1. Prednisone taper.  2. Veramyst 2 sprays daily.  3. Albuterol inhaler p.r.n.  4. DuoNeb handheld nebulizer p.r.n.   THE PATIENT HAS A QUESTIONABLE ALLERGY TO ADVAIR.   SOCIAL HISTORY:  He is married and has children. He works as a Public house manager. He has a history of smoking one pack per day for many years.  He has not smoked in 20+  years.   FAMILY HISTORY:  Noncontributory.   REVIEW OF SYSTEMS:  As per history of present illness. Also patient  intake form documented in the chart.   PHYSICAL EXAMINATION:  In general, he is a thin, white male in no acute  distress. Blood pressure is 110/60, pulse 72, temperature 98.5, weight  is 182 pounds. He is 6 foot 2 inches tall. O2 saturation on room air is  97%.  HEENT:  Pupils are equal, round, and reactive to light and  accommodation. Extraocular movements are intact. Nares are patent  without discharge. Oropharynx is clear.  NECK:  Supple without JVD or lymphadenopathy. No palpable thyromegaly.  CHEST:  Totally clear to auscultation.  CARDIAC:  Reveals a regular rate and rhythm. No murmurs, rubs or  gallops.  ABDOMEN:  Soft, nontender, with good bowel sounds.  GENITAL EXAM, RECTAL EXAM, BREAST EXAM:  Not done and not indicated.  LOWER EXTREMITIES:  Are without edema. Pulses are intact distally.  NEUROLOGICAL:  Alert and oriented with no obvious motor deficits.   LABORATORY DATA:  Chest x-ray from December shows a questionable  infiltrate in the left  lower lobe with the suggestion of overlying  bronchiectasis.   IMPRESSION:  1. Chronic cough with other pulmonary symptoms that may be secondary      to bronchiectasis with reactive airways disease. The other      possibility is the patient just has a cyclical cough since August      that has not been adequately treated. At this point in time, I      would like to try and see if there are triggering factors for his      cough such as underlying asthma, chronic infection, reflux disease      or possibly sinus disease. The patient seems to be doing better      since he has been on prednisone and will go ahead and finish up the      taper while are doing further evaluation.   PLAN:  1. CT scan of the chest with high resolution cuts to rule out      bronchiectasis as well as a limited CT of the sinuses.  2. Finish prednisone taper and use p.r.n. albuterol.  3. The patient will follow up next week with spironolactone and also      to discuss his CT results.     Barbaraann Share, MD,FCCP  Electronically Signed    KMC/MedQ  DD: 05/07/2006  DT: 05/07/2006  Job #: [id number]   cc:   Janece Canterbury

## 2010-05-30 ENCOUNTER — Encounter: Payer: Self-pay | Admitting: Internal Medicine

## 2010-09-28 LAB — CBC
MCV: 85.2
Platelets: 239
WBC: 3.6 — ABNORMAL LOW

## 2010-09-28 LAB — BASIC METABOLIC PANEL
BUN: 8
Calcium: 8.3 — ABNORMAL LOW
Creatinine, Ser: 0.89
GFR calc Af Amer: >60
GFR calc non Af Amer: >60

## 2010-10-10 LAB — CBC
MCV: 81.6
Platelets: 268
WBC: 10.4

## 2010-10-10 LAB — BASIC METABOLIC PANEL
BUN: 13
Calcium: 9
Chloride: 99
Creatinine, Ser: 1.08

## 2010-10-10 LAB — EXPECTORATED SPUTUM ASSESSMENT W GRAM STAIN, RFLX TO RESP C

## 2010-10-10 LAB — CULTURE, RESPIRATORY W GRAM STAIN
Culture: NORMAL
Gram Stain: NONE SEEN

## 2010-10-13 LAB — BASIC METABOLIC PANEL
Calcium: 8.4
Chloride: 99
Creatinine, Ser: 0.89
GFR calc Af Amer: >60
GFR calc non Af Amer: >60

## 2010-10-13 LAB — COMPREHENSIVE METABOLIC PANEL
BUN: 9 mg/dL (ref 6–23)
Calcium: 8.2 mg/dL — ABNORMAL LOW (ref 8.4–10.5)
Glucose, Bld: 91 mg/dL (ref 70–99)
Total Protein: 6.2 g/dL (ref 6.0–8.3)

## 2010-10-13 LAB — I-STAT 8, (EC8 V) (CONVERTED LAB)
BUN: 9
Chloride: 102
HCT: 43
Hemoglobin: 14.6
Operator id: 198171
Potassium: 4.2
pCO2, Ven: 58.8 — ABNORMAL HIGH

## 2010-10-13 LAB — URINE CULTURE: Colony Count: 4000

## 2010-10-13 LAB — URINALYSIS, ROUTINE W REFLEX MICROSCOPIC
Glucose, UA: NEGATIVE mg/dL
Ketones, ur: NEGATIVE mg/dL
Nitrite: NEGATIVE
Protein, ur: NEGATIVE mg/dL

## 2010-10-13 LAB — CULTURE, ROUTINE-SINUS

## 2010-10-13 LAB — DIFFERENTIAL
Basophils Relative: 1
Eosinophils Relative: 40 — ABNORMAL HIGH
Lymphocytes Relative: 25 % (ref 12–46)
Lymphocytes Relative: 21
Lymphs Abs: 2 10*3/uL (ref 0.7–4.0)
Monocytes Relative: 13 % — ABNORMAL HIGH (ref 3–12)
Neutro Abs: 2.9 10*3/uL (ref 1.7–7.7)
Neutrophils Relative %: 37 % — ABNORMAL LOW (ref 43–77)
Neutrophils Relative %: 34 — ABNORMAL LOW

## 2010-10-13 LAB — CBC
HCT: 42 % (ref 39.0–52.0)
HCT: 40.4
Hemoglobin: 13.5 g/dL (ref 13.0–17.0)
Hemoglobin: 13.3
MCHC: 32.2 g/dL (ref 30.0–36.0)
Platelets: 236
RBC: 4.62
RDW: 18.8 % — ABNORMAL HIGH (ref 11.5–15.5)
RDW: 15.2
WBC: 9.8

## 2010-10-24 LAB — BASIC METABOLIC PANEL
CO2: 32
Calcium: 9.6
Chloride: 99
GFR calc Af Amer: >60
Sodium: 139

## 2010-10-24 LAB — CBC
Hemoglobin: 15.6
MCHC: 33.7
MCV: 88.2
RBC: 5.25
WBC: 9

## 2012-07-30 ENCOUNTER — Other Ambulatory Visit (HOSPITAL_COMMUNITY): Payer: Self-pay | Admitting: Otolaryngology

## 2012-08-01 ENCOUNTER — Encounter (HOSPITAL_COMMUNITY): Payer: Self-pay | Admitting: Pharmacy Technician

## 2012-08-01 ENCOUNTER — Encounter (HOSPITAL_COMMUNITY)
Admission: RE | Admit: 2012-08-01 | Discharge: 2012-08-01 | Disposition: A | Discharge status: Home / Self Care | Payer: BC Managed Care – PPO | Source: Ambulatory Visit | Attending: Anesthesiology | Admitting: Anesthesiology

## 2012-08-01 ENCOUNTER — Encounter (HOSPITAL_COMMUNITY)
Admission: RE | Admit: 2012-08-01 | Discharge: 2012-08-01 | Disposition: A | Discharge status: Home / Self Care | Payer: BC Managed Care – PPO | Source: Ambulatory Visit | Attending: Otolaryngology | Admitting: Otolaryngology

## 2012-08-01 ENCOUNTER — Encounter (HOSPITAL_COMMUNITY): Payer: Self-pay

## 2012-08-01 DIAGNOSIS — J45909 Unspecified asthma, uncomplicated: Secondary | ICD-10-CM | POA: Insufficient documentation

## 2012-08-01 DIAGNOSIS — Z01812 Encounter for preprocedural laboratory examination: Secondary | ICD-10-CM | POA: Insufficient documentation

## 2012-08-01 DIAGNOSIS — Z01818 Encounter for other preprocedural examination: Secondary | ICD-10-CM | POA: Insufficient documentation

## 2012-08-01 DIAGNOSIS — J329 Chronic sinusitis, unspecified: Secondary | ICD-10-CM | POA: Insufficient documentation

## 2012-08-01 DIAGNOSIS — Z8505 Personal history of malignant neoplasm of liver: Secondary | ICD-10-CM | POA: Insufficient documentation

## 2012-08-01 HISTORY — DX: Malignant (primary) neoplasm, unspecified: C80.1

## 2012-08-01 HISTORY — DX: Pneumonia, unspecified organism: J18.9

## 2012-08-01 HISTORY — DX: Headache: R51

## 2012-08-01 LAB — COMPREHENSIVE METABOLIC PANEL
AST: 21 U/L (ref 0–37)
Albumin: 4.3 g/dL (ref 3.5–5.2)
Calcium: 9.8 mg/dL (ref 8.4–10.5)
Creatinine, Ser: 0.96 mg/dL (ref 0.50–1.35)
Sodium: 138 mEq/L (ref 135–145)
Total Protein: 7.8 g/dL (ref 6.0–8.3)

## 2012-08-01 LAB — CBC
MCH: 32.2 pg (ref 26.0–34.0)
MCV: 92.3 fL (ref 78.0–100.0)
Platelets: 241 10*3/uL (ref 150–400)
RDW: 13.6 % (ref 11.5–15.5)
WBC: 9.2 10*3/uL (ref 4.0–10.5)

## 2012-08-01 NOTE — Pre-Procedure Instructions (Signed)
[facility]  08/01/2012   Your procedure is scheduled on:  Wednesday, July 30th  Report to [facility] at 1030 AM.  Call this number if you have problems the morning of surgery: [phone]   Remember:   Do not eat food or drink liquids after midnight.   Take these medicines the morning of surgery with A SIP OF WATER: Celexa, Neurontin, Ativan if needed, prilosec, ultram if needed   Do not wear jewelry.  Do not wear lotions, powders, or perfumes, deodorant.  Do not shave 48 hours prior to surgery. Men may shave face and neck.  Do not bring valuables to the hospital.  [facility] is not responsible for any belongings or valuables.  Contacts, dentures or bridgework may not be worn into surgery.  Leave suitcase in the car. After surgery it may be brought to your room.  For patients admitted to the hospital, checkout time is 11:00 AM the day of discharge.   Patients discharged the day of surgery will not be allowed to drive home.   Special Instructions: Shower using CHG 2 nights before surgery and the night before surgery.  If you shower the day of surgery use CHG.  Use special wash - you have one bottle of CHG for all showers.  You should use approximately 1/3 of the bottle for each shower.   Please read over the following fact sheets that you were given: Pain Booklet, Coughing and Deep Breathing and Surgical Site Infection Prevention

## 2012-08-01 NOTE — Progress Notes (Signed)
Primary Physician - Bowls Kathryne Sharper Does not have a cardiologist No recent cardiac testing

## 2012-08-04 ENCOUNTER — Encounter (HOSPITAL_COMMUNITY): Payer: Self-pay

## 2012-08-04 NOTE — Progress Notes (Signed)
Anesthesia Note:  Patient had an EKG on 08/01/12 at his PAT visit but all of the patient identifiers did not match, so it was never uploaded to Epic and was deleted.  He lives in [place].  Ethmoidectomy is scheduled for 08/06/12.  He has no known cardiac history, but has chronic sinusitis, asthma, and has undergone numerous operative procedures in the past including multiple sinus surgeries including ethmoidectomy 02/14/10, colon surgery and liver lobectomy for cancer, and lung surgery for pneumonia (empyema?).  I have printed three prior EKGs from [facility] dated between 07/2006-02/2010 that can be used as comparison if needed when his EKG is repeated on the day of surgery. There was concern for possible ST elevation on his previous EKG from 02/13/10, but apparently patient was asymptomatic and later tolerated bilateral ethmoidectomy with nasal/sinus polypectomy, sinus exploration by Dr. Annalee Genta the following day.  I was not asked to evaluate Ralph Hill during his PAT visit, so he will be evaluated by his assigned anesthesiologist on the day of surgery.  Ralph Chock, PA-C [facility]/Anesthesiology Phone [phone] 08/04/2012 6:00 PM

## 2012-08-06 ENCOUNTER — Ambulatory Visit (HOSPITAL_COMMUNITY): Payer: BC Managed Care – PPO | Admitting: Anesthesiology

## 2012-08-06 ENCOUNTER — Observation Stay (HOSPITAL_COMMUNITY)
Admission: RE | Admit: 2012-08-06 | Discharge: 2012-08-07 | Disposition: A | Discharge status: Home / Self Care | Payer: BC Managed Care – PPO | Source: Ambulatory Visit | Attending: Otolaryngology | Admitting: Otolaryngology

## 2012-08-06 ENCOUNTER — Encounter (HOSPITAL_COMMUNITY): Payer: Self-pay | Admitting: Anesthesiology

## 2012-08-06 ENCOUNTER — Encounter (HOSPITAL_COMMUNITY): Payer: Self-pay | Admitting: Vascular Surgery

## 2012-08-06 ENCOUNTER — Encounter (HOSPITAL_COMMUNITY)
Admission: RE | Disposition: A | Discharge status: Home / Self Care | Payer: Self-pay | Source: Ambulatory Visit | Attending: Otolaryngology

## 2012-08-06 DIAGNOSIS — J321 Chronic frontal sinusitis: Principal | ICD-10-CM | POA: Insufficient documentation

## 2012-08-06 DIAGNOSIS — J328 Other chronic sinusitis: Secondary | ICD-10-CM | POA: Insufficient documentation

## 2012-08-06 DIAGNOSIS — J3489 Other specified disorders of nose and nasal sinuses: Secondary | ICD-10-CM | POA: Insufficient documentation

## 2012-08-06 DIAGNOSIS — J338 Other polyp of sinus: Secondary | ICD-10-CM | POA: Insufficient documentation

## 2012-08-06 HISTORY — PX: SINUS ENDO W/FUSION: SHX777

## 2012-08-06 SURGERY — SINUS SURGERY, ENDOSCOPIC, USING COMPUTER-ASSISTED NAVIGATION
Anesthesia: General | Site: Nose | Laterality: Bilateral | Wound class: Clean Contaminated

## 2012-08-06 MED ORDER — DIPHENHYDRAMINE HCL 12.5 MG/5ML PO ELIX: 12.5000 mg | ORAL_SOLUTION | Freq: Four times a day (QID) | ORAL | Status: DC | PRN

## 2012-08-06 MED ORDER — LIDOCAINE-EPINEPHRINE 1 %-1:100000 IJ SOLN
INTRAMUSCULAR | Status: DC | PRN
  Administered 2012-08-06: 14 mL

## 2012-08-06 MED ORDER — OXYMETAZOLINE HCL 0.05 % NA SOLN: 2.0000 | Freq: Two times a day (BID) | NASAL | Status: DC | PRN

## 2012-08-06 MED ORDER — LACTATED RINGERS IV SOLN
INTRAVENOUS | Status: DC
  Administered 2012-08-06: 12:00:00 via INTRAVENOUS

## 2012-08-06 MED ORDER — ONDANSETRON HCL 4 MG/2ML IJ SOLN: 4.0000 mg | Freq: Four times a day (QID) | INTRAMUSCULAR | Status: DC | PRN

## 2012-08-06 MED ORDER — MORPHINE SULFATE 2 MG/ML IJ SOLN: 1.0000 mg | INTRAMUSCULAR | Status: DC | PRN

## 2012-08-06 MED ORDER — OXYCODONE HCL 5 MG/5ML PO SOLN: 5.0000 mg | Freq: Once | ORAL | Status: DC | PRN

## 2012-08-06 MED ORDER — LIDOCAINE-EPINEPHRINE 1 %-1:100000 IJ SOLN
INTRAMUSCULAR | Status: AC
  Filled 2012-08-06: qty 1

## 2012-08-06 MED ORDER — ACETAMINOPHEN 650 MG RE SUPP: 650.0000 mg | Freq: Four times a day (QID) | RECTAL | Status: DC | PRN

## 2012-08-06 MED ORDER — METOCLOPRAMIDE HCL 5 MG/ML IJ SOLN
10.0000 mg | Freq: Once | INTRAMUSCULAR | Status: AC | PRN
  Administered 2012-08-06: 10 mg via INTRAVENOUS

## 2012-08-06 MED ORDER — LIDOCAINE HCL 4 % MT SOLN
OROMUCOSAL | Status: DC | PRN
  Administered 2012-08-06: 4 mL via TOPICAL

## 2012-08-06 MED ORDER — OXYMETAZOLINE HCL 0.05 % NA SOLN
NASAL | Status: AC
  Filled 2012-08-06: qty 15

## 2012-08-06 MED ORDER — PHENYLEPHRINE HCL 10 MG/ML IJ SOLN
INTRAMUSCULAR | Status: DC | PRN
  Administered 2012-08-06 (×2): 40 ug via INTRAVENOUS

## 2012-08-06 MED ORDER — ONDANSETRON HCL 4 MG/2ML IJ SOLN
INTRAMUSCULAR | Status: DC | PRN
  Administered 2012-08-06: 4 mg via INTRAVENOUS

## 2012-08-06 MED ORDER — PHENOL 1.4 % MT LIQD: 2.0000 | Freq: Three times a day (TID) | OROMUCOSAL | Status: DC | PRN

## 2012-08-06 MED ORDER — CLINDAMYCIN PHOSPHATE 600 MG/50ML IV SOLN
600.0000 mg | INTRAVENOUS | Status: DC
  Filled 2012-08-06: qty 50

## 2012-08-06 MED ORDER — MUPIROCIN CALCIUM 2 % NA OINT
TOPICAL_OINTMENT | NASAL | Status: DC | PRN
  Administered 2012-08-06: 1 via NASAL

## 2012-08-06 MED ORDER — MUPIROCIN CALCIUM 2 % EX CREA
TOPICAL_CREAM | CUTANEOUS | Status: AC
  Filled 2012-08-06: qty 15

## 2012-08-06 MED ORDER — GLYCOPYRROLATE 0.2 MG/ML IJ SOLN
INTRAMUSCULAR | Status: DC | PRN
  Administered 2012-08-06: 0.6 mg via INTRAVENOUS

## 2012-08-06 MED ORDER — LACTATED RINGERS IV SOLN
INTRAVENOUS | Status: DC | PRN
  Administered 2012-08-06 (×2): via INTRAVENOUS

## 2012-08-06 MED ORDER — DEXAMETHASONE SODIUM PHOSPHATE 10 MG/ML IJ SOLN
10.0000 mg | Freq: Once | INTRAMUSCULAR | Status: DC
  Filled 2012-08-06: qty 1

## 2012-08-06 MED ORDER — LIDOCAINE HCL (CARDIAC) 20 MG/ML IV SOLN
INTRAVENOUS | Status: DC | PRN
  Administered 2012-08-06: 100 mg via INTRAVENOUS

## 2012-08-06 MED ORDER — OXYMETAZOLINE HCL 0.05 % NA SOLN
NASAL | Status: DC | PRN
  Administered 2012-08-06: 1 via NASAL

## 2012-08-06 MED ORDER — ACETAMINOPHEN 325 MG PO TABS: 650.0000 mg | ORAL_TABLET | Freq: Four times a day (QID) | ORAL | Status: DC | PRN

## 2012-08-06 MED ORDER — SODIUM CHLORIDE 0.9 % IR SOLN
Status: DC | PRN
  Administered 2012-08-06 (×2): 1000 mL

## 2012-08-06 MED ORDER — MIDAZOLAM HCL 5 MG/5ML IJ SOLN
INTRAMUSCULAR | Status: DC | PRN
  Administered 2012-08-06: 2 mg via INTRAVENOUS

## 2012-08-06 MED ORDER — DEXAMETHASONE SODIUM PHOSPHATE 10 MG/ML IJ SOLN
10.0000 mg | Freq: Three times a day (TID) | INTRAMUSCULAR | Status: DC
  Administered 2012-08-06 – 2012-08-07 (×2): 10 mg via INTRAVENOUS
  Filled 2012-08-06 (×3): qty 1

## 2012-08-06 MED ORDER — METOCLOPRAMIDE HCL 5 MG/ML IJ SOLN
INTRAMUSCULAR | Status: AC
  Filled 2012-08-06: qty 2

## 2012-08-06 MED ORDER — DIPHENHYDRAMINE HCL 50 MG/ML IJ SOLN: 12.5000 mg | Freq: Four times a day (QID) | INTRAMUSCULAR | Status: DC | PRN

## 2012-08-06 MED ORDER — HYDROMORPHONE HCL PF 1 MG/ML IJ SOLN
INTRAMUSCULAR | Status: AC
  Administered 2012-08-06: 0.5 mg via INTRAVENOUS
  Filled 2012-08-06: qty 1

## 2012-08-06 MED ORDER — ROCURONIUM BROMIDE 100 MG/10ML IV SOLN
INTRAVENOUS | Status: DC | PRN
  Administered 2012-08-06: 30 mg via INTRAVENOUS
  Administered 2012-08-06: 20 mg via INTRAVENOUS
  Administered 2012-08-06: 50 mg via INTRAVENOUS

## 2012-08-06 MED ORDER — OXYCODONE HCL 5 MG PO TABS
10.0000 mg | ORAL_TABLET | ORAL | Status: DC | PRN
  Administered 2012-08-06 – 2012-08-07 (×3): 10 mg via ORAL
  Filled 2012-08-06 (×4): qty 2

## 2012-08-06 MED ORDER — ZOLPIDEM TARTRATE 5 MG PO TABS: 5.0000 mg | ORAL_TABLET | Freq: Every evening | ORAL | Status: DC | PRN

## 2012-08-06 MED ORDER — MOMETASONE FURO-FORMOTEROL FUM 100-5 MCG/ACT IN AERO
2.0000 | INHALATION_SPRAY | Freq: Two times a day (BID) | RESPIRATORY_TRACT | Status: DC
  Administered 2012-08-06: 2 via RESPIRATORY_TRACT
  Filled 2012-08-06: qty 8.8

## 2012-08-06 MED ORDER — CLINDAMYCIN PHOSPHATE 600 MG/50ML IV SOLN
600.0000 mg | Freq: Once | INTRAVENOUS | Status: AC
  Administered 2012-08-06: 600 mg via INTRAVENOUS

## 2012-08-06 MED ORDER — NEOSTIGMINE METHYLSULFATE 1 MG/ML IJ SOLN
INTRAMUSCULAR | Status: DC | PRN
  Administered 2012-08-06: 4 mg via INTRAVENOUS

## 2012-08-06 MED ORDER — DEXAMETHASONE SODIUM PHOSPHATE 10 MG/ML IJ SOLN
INTRAMUSCULAR | Status: DC | PRN
  Administered 2012-08-06: 10 mg via INTRAVENOUS

## 2012-08-06 MED ORDER — HYDROMORPHONE HCL PF 1 MG/ML IJ SOLN
0.2500 mg | INTRAMUSCULAR | Status: DC | PRN
  Administered 2012-08-06: 0.5 mg via INTRAVENOUS

## 2012-08-06 MED ORDER — FENTANYL CITRATE 0.05 MG/ML IJ SOLN
INTRAMUSCULAR | Status: DC | PRN
  Administered 2012-08-06 (×2): 50 ug via INTRAVENOUS
  Administered 2012-08-06: 100 ug via INTRAVENOUS
  Administered 2012-08-06: 50 ug via INTRAVENOUS

## 2012-08-06 MED ORDER — CLINDAMYCIN HCL 150 MG PO CAPS
150.0000 mg | ORAL_CAPSULE | Freq: Four times a day (QID) | ORAL | Status: DC
  Administered 2012-08-06 – 2012-08-07 (×3): 150 mg via ORAL
  Filled 2012-08-06 (×8): qty 1

## 2012-08-06 MED ORDER — OXYCODONE HCL 5 MG PO TABS: 5.0000 mg | ORAL_TABLET | Freq: Once | ORAL | Status: DC | PRN

## 2012-08-06 MED ORDER — PROPOFOL 10 MG/ML IV BOLUS
INTRAVENOUS | Status: DC | PRN
  Administered 2012-08-06: 200 mg via INTRAVENOUS

## 2012-08-06 MED ORDER — VECURONIUM BROMIDE 10 MG IV SOLR
INTRAVENOUS | Status: DC | PRN
  Administered 2012-08-06: 1 mg via INTRAVENOUS
  Administered 2012-08-06 (×3): 2 mg via INTRAVENOUS
  Administered 2012-08-06: 1 mg via INTRAVENOUS
  Administered 2012-08-06 (×2): 2 mg via INTRAVENOUS

## 2012-08-06 MED ORDER — KCL IN DEXTROSE-NACL 10-5-0.45 MEQ/L-%-% IV SOLN
INTRAVENOUS | Status: DC
  Administered 2012-08-06: 21:00:00 via INTRAVENOUS
  Filled 2012-08-06 (×3): qty 1000

## 2012-08-06 MED ORDER — DOCUSATE SODIUM 100 MG PO CAPS: 100.0000 mg | ORAL_CAPSULE | Freq: Two times a day (BID) | ORAL | Status: DC | PRN

## 2012-08-06 SURGICAL SUPPLY — 53 items
ATTRACTOMAT 16X20 MAGNETIC DRP (DRAPES) ×2 IMPLANT
BLADE RAD 40 CVD SINUS 4MM (BLADE) IMPLANT
BLADE RAD60 ROTATE M4 4 5PK (BLADE) ×2 IMPLANT
BLADE ROTATE RAD 40 4 M4 (BLADE) IMPLANT
BLADE ROTATE TRICUT 4X13 M4 (BLADE) ×2 IMPLANT
BUR DIAMOND 13X5 70D (BURR) ×2 IMPLANT
BUR DIAMOND CURV 15X5 15D (BURR) IMPLANT
CANISTER SUCTION 2500CC (MISCELLANEOUS) ×4 IMPLANT
CLOTH BEACON ORANGE TIMEOUT ST (SAFETY) ×2 IMPLANT
COAGULATOR SUCT SWTCH 10FR 6 (ELECTROSURGICAL) ×2 IMPLANT
CRADLE DONUT ADULT HEAD (MISCELLANEOUS) ×2 IMPLANT
DECANTER SPIKE VIAL GLASS SM (MISCELLANEOUS) ×2 IMPLANT
DRESSING NASAL POPE 10X1.5X2.5 (GAUZE/BANDAGES/DRESSINGS) IMPLANT
DRESSING TELFA 8X3 (GAUZE/BANDAGES/DRESSINGS) IMPLANT
DRSG NASAL POPE 10X1.5X2.5 (GAUZE/BANDAGES/DRESSINGS)
DRSG NASOPORE 8CM (GAUZE/BANDAGES/DRESSINGS) IMPLANT
DURASEAL SPINE SEALANT 3ML (MISCELLANEOUS) IMPLANT
ELECT COATED BLADE 2.86 ST (ELECTRODE) ×2 IMPLANT
ELECT REM PT RETURN 9FT ADLT (ELECTROSURGICAL)
ELECTRODE REM PT RTRN 9FT ADLT (ELECTROSURGICAL) IMPLANT
FILTER ARTHROSCOPY CONVERTOR (FILTER) ×2 IMPLANT
FLOSEAL 10ML (HEMOSTASIS) IMPLANT
GAUZE PACKING IODOFORM 1/2 (PACKING) ×2 IMPLANT
GLOVE SURG SS PI 7.5 STRL IVOR (GLOVE) ×2 IMPLANT
GOWN STRL NON-REIN LRG LVL3 (GOWN DISPOSABLE) ×4 IMPLANT
KIT BASIN OR (CUSTOM PROCEDURE TRAY) ×2 IMPLANT
KIT ROOM TURNOVER OR (KITS) ×2 IMPLANT
NEEDLE SPNL 20GX3.5 QUINCKE YW (NEEDLE) ×2 IMPLANT
NS IRRIG 1000ML POUR BTL (IV SOLUTION) ×2 IMPLANT
PAD ARMBOARD 7.5X6 YLW CONV (MISCELLANEOUS) ×4 IMPLANT
PAD ENT ADHESIVE 25PK (MISCELLANEOUS) ×2 IMPLANT
PATTIES SURGICAL .5 X3 (DISPOSABLE) ×2 IMPLANT
SHEATH ENDOSCRUB 0 DEG (SHEATH) IMPLANT
SHEATH ENDOSCRUB 30 DEG (SHEATH) IMPLANT
SHEATH ENDOSCRUB 45 DEG (SHEATH) IMPLANT
SOLUTION ANTI FOG 6CC (MISCELLANEOUS) ×2 IMPLANT
SPECIMEN JAR SMALL (MISCELLANEOUS) ×4 IMPLANT
STRIP CLOSURE SKIN 1/2X4 (GAUZE/BANDAGES/DRESSINGS) ×2 IMPLANT
SUT ETHILON 3 0 PS 1 (SUTURE) ×2 IMPLANT
SUT PLAIN 4 0 ~~LOC~~ 1 (SUTURE) ×2 IMPLANT
SUT SILK 2 0 SH (SUTURE) IMPLANT
SWAB COLLECTION DEVICE MRSA (MISCELLANEOUS) ×2 IMPLANT
SYR 50ML SLIP (SYRINGE) IMPLANT
SYRINGE 10CC LL (SYRINGE) ×2 IMPLANT
TOWEL OR 17X24 6PK STRL BLUE (TOWEL DISPOSABLE) ×2 IMPLANT
TOWEL OR 17X26 10 PK STRL BLUE (TOWEL DISPOSABLE) ×2 IMPLANT
TRACKER ENT INSTRUMENT (MISCELLANEOUS) ×2 IMPLANT
TRACKER ENT PATIENT (MISCELLANEOUS) ×2 IMPLANT
TRAY ENT MC OR (CUSTOM PROCEDURE TRAY) ×2 IMPLANT
TUBE CONNECTING 12X1/4 (SUCTIONS) ×4 IMPLANT
TUBING STRAIGHTSHOT EPS 5PK (TUBING) IMPLANT
WATER STERILE IRR 1000ML POUR (IV SOLUTION) ×2 IMPLANT
WIPE INSTRUMENT VISIWIPE 73X73 (MISCELLANEOUS) ×2 IMPLANT

## 2012-08-06 NOTE — Transfer of Care (Signed)
Immediate Anesthesia Transfer of Care Note  Patient: Ralph Hill  Procedure(s) Performed: Procedure(s): ENDOSCOPIC SINUS SURGERY WITH FUSION NAVIGATION Total Ethmoidectomy with naso frontal recess exploration (Bilateral)  Patient Location: PACU  Anesthesia Type:General  Level of Consciousness: awake, alert , oriented and patient cooperative  Airway & Oxygen Therapy: Patient Spontanous Breathing and Patient connected to face mask oxygen  Post-op Assessment: Report given to PACU RN, Post -op Vital signs reviewed and stable and Patient moving all extremities X 4  Post vital signs: Reviewed and stable  Complications: No apparent anesthesia complications

## 2012-08-06 NOTE — Anesthesia Preprocedure Evaluation (Signed)
Anesthesia Evaluation  Patient identified by MRN, date of birth, ID band Patient awake    Reviewed: Allergy & Precautions, H&P , NPO status , Patient's Chart, lab work & pertinent test results, reviewed documented beta blocker date and time   Airway Mallampati: II TM Distance: >3 FB Neck ROM: full    Dental   Pulmonary asthma , pneumonia -, resolved,  breath sounds clear to auscultation        Cardiovascular negative cardio ROS  Rhythm:regular     Neuro/Psych  Headaches, negative psych ROS   GI/Hepatic negative GI ROS, Neg liver ROS,   Endo/Other  negative endocrine ROS  Renal/GU negative Renal ROS  negative genitourinary   Musculoskeletal   Abdominal   Peds  Hematology negative hematology ROS (+)   Anesthesia Other Findings See surgeon's H&P   Reproductive/Obstetrics negative OB ROS                           Anesthesia Physical Anesthesia Plan  ASA: II  Anesthesia Plan: General   Post-op Pain Management:    Induction: Intravenous  Airway Management Planned: Oral ETT  Additional Equipment:   Intra-op Plan:   Post-operative Plan: Extubation in OR  Informed Consent: I have reviewed the patients History and Physical, chart, labs and discussed the procedure including the risks, benefits and alternatives for the proposed anesthesia with the patient or authorized representative who has indicated his/her understanding and acceptance.   Dental Advisory Given  Plan Discussed with: CRNA and Surgeon  Anesthesia Plan Comments:         Anesthesia Quick Evaluation

## 2012-08-06 NOTE — H&P (Signed)
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  He now presents for revision functional endoscopic sinus surgery with Draf III frontal sinusotomies.  Dr. Emeline Darling, [place] has discussed the risks (bleeding, scarring, recurrent sinusitis, need for further surgery, skull base injury, orbital injury, CSF leak), benefits, and alternatives of this procedure. The patient understands the risks and would like to proceed with the procedure. The chances of success of the procedure are >50% and the patient understands this. I personally performed an examination of the patient within 24 hours of the procedure.  PAST MEDICAL HISTORY: Past Medical History  Diagnosis Date  . Cough   . Sinusitis, chronic   . Intrinsic asthma   . Pneumonia   . Headache(784.0)   . Cancer     colon, liver - surgery & chemo    PAST SURGICAL HISTORY: Past Surgical History  Procedure Laterality Date  . Colectomy    . Sinus endo w/fusion      x3  . Lung surgery Bilateral     "cleaned out from infection"  . Cholecystectomy    . Liver lobectomy    . Hernia repair      inguinal and umbilical/incisonal  . Right knee      ligament repair    MEDICATIONS: No current facility-administered medications on file prior to encounter.   Current Outpatient Prescriptions on File Prior to Encounter  Medication Sig Dispense Refill  . Mometasone Furo-Formoterol Fum (DULERA) 100-5 MCG/ACT AERO Inhale 2 puffs into the lungs 2 (two) times daily.          ALLERGIES: Allergies  Allergen Reactions  . Amoxicillin Hives  . Fluticasone-Salmeterol     REACTION: (question of)    SOCIAL HISTORY: History   Social History  . Marital Status: Married    Spouse Name: N/A    Number of Children: N/A  . Years of Education: N/A   Occupational History  . Emergency planning/management officer     Social History Main Topics  . Smoking status: Former Smoker -- 1.00 packs/day    Types: Cigarettes    Quit date: 01/09/1988  . Smokeless tobacco: Not on file  . Alcohol Use: 6.0 oz/week    10 Cans of beer per week     Comment: daily  . Drug Use: Not on file  . Sexually Active: Not on file   Other Topics Concern  . Not on file   Social History Narrative  . No narrative on file    FAMILY HISTORY: Family History  Problem Relation Age of Onset  . COPD Sister     REVIEW OF SYSTEMS:  HEENT: headaches, sinus symptoms, otherwise negative except per HPI x 10 systems   PHYSICAL EXAM:  GENERAL:  NAD VITAL SIGNS:   Filed Vitals:   08/06/12 1042  BP: 140/87  Pulse: 79  Temp: 97.9 F (36.6 C)  Resp: 20  HEENT:  Oral cavity clear  NECK:  supple LYMPH:  No LAD LUNGS:  Grossly clear CARDIOVASCULAR:  RRR ABDOMEN:  Soft, NT MUSCULOSKELETAL: normal strength PSYCH:  Normal affect NEUROLOGIC:  CN 2-12 intact and symmetric  DIAGNOSTIC STUDIES: CT maxillofacial with right frontal sinus opacification  ASSESSMENT AND PLAN: Plan to proceed with revision bilateral functional endoscopic sinus surgery with DRAF III frontal sinusotomies. Patient understands the risks, benefits, and alternatives.  08/06/2012  12:17 PM [facility]

## 2012-08-06 NOTE — Preoperative (Signed)
Beta Blockers   Reason not to administer Beta Blockers:Not Applicable 

## 2012-08-06 NOTE — Op Note (Signed)
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  Procedures Performed: 61782-CT image guidance 31255-50 bilateral total ethmoidectomies [phone] bilateral maxillary antrostomies without tissue removal 31276-50 bilateral Draf 3 frontal sinusotomies 31288-50 bilateral sphenoidotomies with tissue removal  Operative Findings: bilateral scarring and sinonasal polyposis in the frontal, ethmoid, and anterior portion of the sphenoid sinuses. No injury to the skull base or cribriform plate, no CSF leak, no orbital injury, no ethmoid artery injury.  Specimens: right and left sinus contents, right frontal sinus culture.  Dressings: bilateral frontal sinus iodoform gauze with mupirocin cream sutured to the anterior nasal septum.   ANESTHESIA: General via endotracheal.  ESTIMATED BLOOD LOSS: Approximately 250 mL.  HISTORY OF PRESENT ILLNESS: The patient is a  52yo male who has had chronic sinusitis and multiple sinus surgeries with frontal and ethmoid/sphenoid scarring with chronic frontal sinus mucoceles.    PROCEDURE: The patient was brought to the operating room and placed in the supine position. After adequate endotracheal anesthesia was obtained, the skin was draped in sterile fashion. Lidocaine 1% with 1:100,000 epinephrine was injected into the bilateral greater palatine foramina transorally. Approximately 4 cc total was used. The Fusion [employer] image guidance was placed on the forehead and the patient was registered to the image guidance in teh standard fashion with good accuracy and precision.   Attention then was directed toward the left sinuses. Lidocaine 1%  with 1:100,000 epinephrine was injected in the region of the anterior portion of the left middle turbinate. Significant scarring was seen along the posterior left maxillary sinus antrostomy and basal lamella that was meticulously opened and removed using the straight thru-cuts, and scar tissue was removed from the left maxillary natural ostium.   The anterior and posterior ethmoid air cells were entered using the image guidance suction and dissected up to the skull base and out to the lamina papyrecea using the 55 degree curette and the 3mm Kerrison punch. All of the scar tissue and polypoid mucosa/nasal polyps in the ethmoid cells were meticulously dissected out using the Kerrison and debrider. The skull base and lamina papyracea were preserved throughout.   The left sphenoid natural ostium showed bulky scar tissue and polyps. The left sphenoid was then identified using the image guidance suction and the left sphenoid was widely opened using the 3mm Kerrison all the way to the skull base and lamina papyracea.  Next I switched back to the 45 degree endoscope, 60 degree microdebrider, and curved image guidance suction. The skull base was identified and dissected anteriorly using the 90 degree Kuhn-Bolger curette. Scarring around the Agger Nasi region and dense polyp tissue and scar tissue was taken down and then the left frontal sinus was widely opened using the curved debrider, Stammberger 4mm frontal mushroom punch, and the 90 degree curette. The left middle turbinate was then Bolgerized to the septum in the standard fashion and I removed the superior anterior portion of the left middle turbinate in a Draf 2B fashion.  Attention then was directed toward the right sinuses. Lidocaine 1% with 1:100,000 epinephrine was injected in the region of the anterior portion of the right middle turbinate. Significant scarring was seen along the posterior right maxillary sinus antrostomy and basal lamella that was  meticulously opened and removed using the straight thru-cuts, and  scar tissue was removed from the left maxillary natural ostium.   The anterior and posterior right ethmoid air cells were entered using the image guidance suction and dissected up to the skull base and out to the lamina papyrecea using the 55 degree curette and the 3mm Kerrison punch. All of the scar tissue and polypoid mucosa/nasal polyps in the ethmoid cells were meticulously dissected out using the Kerrison and debrider. The skull base and lamina papyracea were preserved throughout. I used the straight thru-cut to dissect the lateralized right superior middle turbinate off of the orbit and reflected it medially to open up the frontal recess.  The right sphenoid natural ostium showed bulky scar tissue and polyps. The right sphenoid was then identified using the image guidance suction and the right sphenoid was widely opened using the 3mm Kerrison all the way to the skull base and lamina papyracea.  Next I switched back to the 45 degree endoscope, 60 degree microdebrider, and curved image guidance suction. The skull base was identified and dissected anteriorly using the 90 degree Kuhn-Bolger curette. Scarring around the Agger Nasi region and dense polyp tissue and scar tissue was taken down and then the right frontal sinus was widely opened using the curved debrider, Stammberger 4mm frontal mushroom punch, and the 90 degree curette. The right middle turbinate was then Bolgerized to the septum in the standard fashion and I removed the superior anterior portion of the right middle turbinate in a Draf 2B fashion.  Next I made a superior septectomy using the microdebrider and 90 degree Marye Round currette. I then widened the septectomy and interfrontal soft tissue using the 60 degree microdebrider. Of note there were bilateral mucoceles with purulence in the frontal sinuses. I washed out all of the inspissated pus and mucous and  I then used the  70 degree 4mm diamond drill to drill out the entire interfrontal sinus septum until the dissection was flush with the anterior and posterior tables. No skull base injury, dural injury, or CSF leak was seen. Once the Draf 3 frontal sinusotomy was widely opened and the frontal sinuses communicated I bolgerized the middle turbinates wit ha 4-0 plain gut stitch, and packed the frontal sinuses and superior septectomy with mupirocin-coated 1/2 inch iodoform gauze and secured  The gauze to the left septum with a 3-0 nylon stitch.  A thorough irrigation was then carried out in the nasal cavity, and the patient's stomach was suctioned out using a flexible OGtube. The patient was awakened from anesthesia and extubated without difficulty. The patient tolerated the procedure well and returned to the recovery room in stable condition.   Dr. Melvenia Beam was present and performed the entire procedure. 4:15 PM Melvenia Beam 08/06/2012

## 2012-08-07 ENCOUNTER — Encounter (HOSPITAL_COMMUNITY): Payer: Self-pay | Admitting: *Deleted

## 2012-08-07 NOTE — Progress Notes (Signed)
Patient discharged to home with instructions. 

## 2012-08-07 NOTE — Discharge Summary (Signed)
08/07/2012  7:08 AM  Date of Admission: 08/06/2012 Date of Discharge:08/07/2012  Discharge JX:BJYN, Clovis Riley, MD  Admitting WG:NFAO, Clovis Riley, MD  Reason for admission/final discharge diagnosis: chronic frontal, ethmoid, sphenoid sinusitis with frontal sinus mucocele  Labs: see EPIC  Procedure(s) performed: revision bilateral functional endoscopic sinus surgery with Draf III frontal sinusotomies  Discharge Condition: good  Discharge Exam: awake, alert, voiding, CN 2-12 intact and symmetric. Minimal sanguinous drainage from nose but no epistaxis.  Discharge Instructions: No heavy lifting >25lbs. Or strenuous activity or nose blowing x 2 weeks. Rx for zofran and levaquin were sent to pharmacy, Rx for percocet given to patient's wife. follow up with  Dr. Emeline Darling At [facility] on 08/19/12 for packing removal. Use OTC nasal saline spray or netipot or sinus irrigation bottle 2-3 times daily as directed.  Hospital Course: taken to OR 08/06/12 for revision endoscopic sinus surgery with bilateral Draf III frontal sinusotomies. Did well overnight, voiding, taking PO, and discharged on POD#1 in good condition.  Melvenia Beam 7:08 AM 08/07/2012

## 2012-08-07 NOTE — Anesthesia Postprocedure Evaluation (Signed)
Anesthesia Post Note  Patient: Ralph Hill  Procedure(s) Performed: Procedure(s) (LRB): ENDOSCOPIC SINUS SURGERY WITH FUSION NAVIGATION Total Ethmoidectomy with naso frontal recess exploration (Bilateral)  Anesthesia type: General  Patient location: PACU  Post pain: Pain level controlled  Post assessment: Patient's Cardiovascular Status Stable  Last Vitals:  Filed Vitals:   08/07/12 0500  BP: 121/84  Pulse: 86  Temp: 36.6 C  Resp: 20    Post vital signs: Reviewed and stable  Level of consciousness: alert  Complications: No apparent anesthesia complications

## 2012-08-08 ENCOUNTER — Encounter (HOSPITAL_COMMUNITY): Payer: Self-pay | Admitting: Otolaryngology

## 2012-08-08 LAB — CULTURE, ROUTINE-SINUS

## 2014-12-16 IMAGING — CR DG CHEST 2V
2 series · 2 of 2 positions shown · non-contrast
Comparison: 04/12/2010

CLINICAL DATA: Endoscopic surgery with sinus exploration.

CHEST - 2 VIEW

[w chest pa]
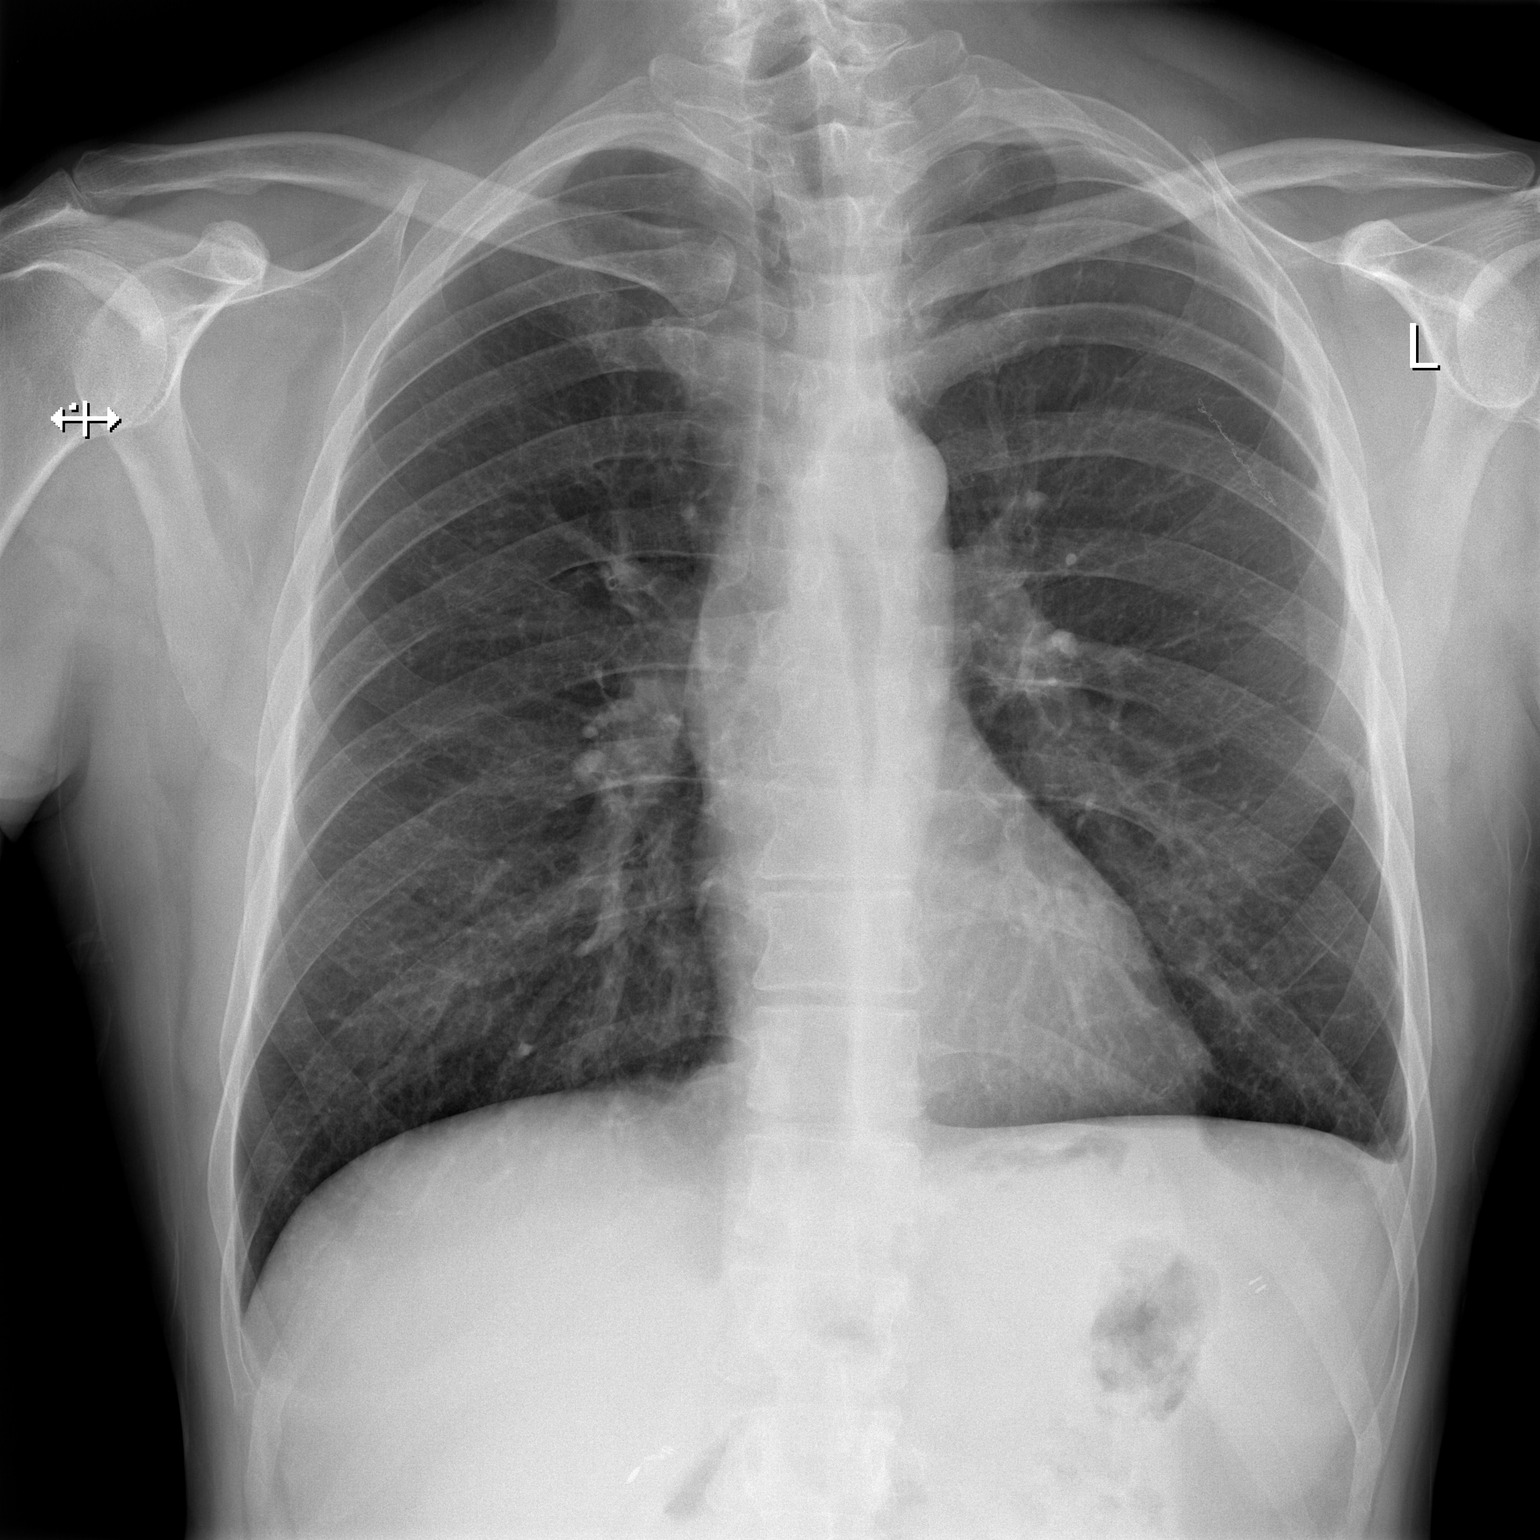

[w chest lat]
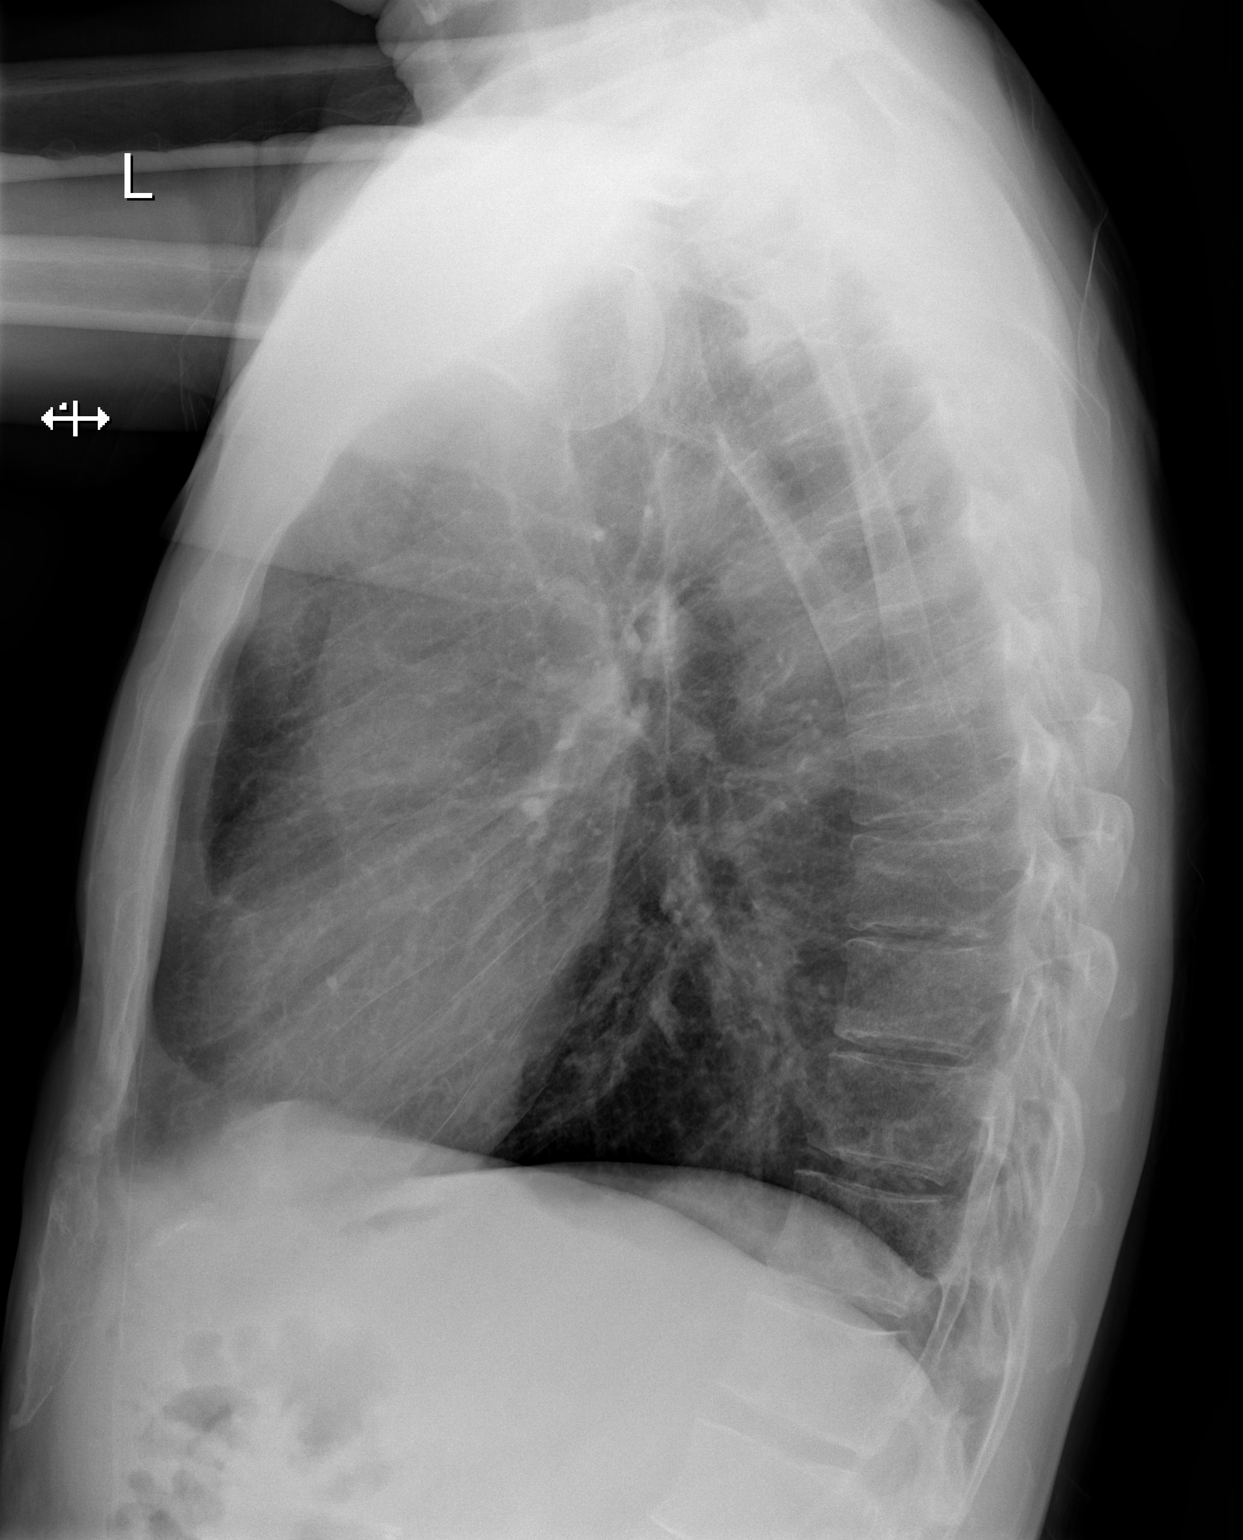

[2 of 2 positions shown; findings below may reference images not displayed]

FINDINGS: Lateral view degraded by patient arm position.

Mild hyperinflation.  Midline trachea.  Suspect air within the mid
thoracic esophagus.  Otherwise normal heart size and mediastinal
contours.  Blunting of the left costophrenic angle on the frontal
is chronic and likely due to pleural thickening. No pleural
effusion or pneumothorax.  Left upper lobe scarring.  Surgical
clips in the right upper quadrant and left upper quadrant.
IMPRESSION: 1. No acute cardiopulmonary disease.
2.  Air within the thoracic esophagus suspected.  This could
represent esophageal dysmotility.
3.  Chronic left-sided pleural thickening.
